# Patient Record
Sex: Female | Born: 1975
Health system: Southern US, Community
[De-identification: ages and names within clinical notes are randomized; demographics above are authoritative.]

## PROBLEM LIST (undated history)

## (undated) DIAGNOSIS — E7800 Pure hypercholesterolemia, unspecified: Secondary | ICD-10-CM

## (undated) DIAGNOSIS — K5900 Constipation, unspecified: Secondary | ICD-10-CM

## (undated) DIAGNOSIS — K8050 Calculus of bile duct without cholangitis or cholecystitis without obstruction: Secondary | ICD-10-CM

## (undated) DIAGNOSIS — I639 Cerebral infarction, unspecified: Secondary | ICD-10-CM

## (undated) DIAGNOSIS — I10 Essential (primary) hypertension: Secondary | ICD-10-CM

## (undated) DIAGNOSIS — E119 Type 2 diabetes mellitus without complications: Secondary | ICD-10-CM

## (undated) DIAGNOSIS — R079 Chest pain, unspecified: Secondary | ICD-10-CM

## (undated) DIAGNOSIS — K219 Gastro-esophageal reflux disease without esophagitis: Secondary | ICD-10-CM

## (undated) DIAGNOSIS — D649 Anemia, unspecified: Secondary | ICD-10-CM

## (undated) HISTORY — PX: COLONOSCOPY: SHX174

## (undated) HISTORY — DX: Anemia, unspecified: D64.9

## (undated) HISTORY — DX: Morbid (severe) obesity due to excess calories: E66.01

## (undated) HISTORY — DX: Constipation, unspecified: K59.00

## (undated) HISTORY — DX: Essential (primary) hypertension: I10

## (undated) HISTORY — DX: Pure hypercholesterolemia, unspecified: E78.00

## (undated) HISTORY — DX: Cerebral infarction, unspecified: I63.9

## (undated) HISTORY — PX: REDUCTION MAMMAPLASTY: SUR839

## (undated) HISTORY — PX: WISDOM TOOTH EXTRACTION: SHX21

---
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
## 2001-10-15 HISTORY — PX: TUBAL LIGATION: SHX77

## 2002-10-15 HISTORY — PX: BREAST REDUCTION SURGERY: SHX8

## 2002-10-15 HISTORY — PX: REDUCTION MAMMAPLASTY: SUR839

## 2017-04-29 ENCOUNTER — Encounter: Payer: Self-pay | Admitting: Family Medicine

## 2017-08-29 DIAGNOSIS — J069 Acute upper respiratory infection, unspecified: Secondary | ICD-10-CM | POA: Diagnosis not present

## 2017-10-15 DIAGNOSIS — G459 Transient cerebral ischemic attack, unspecified: Secondary | ICD-10-CM

## 2017-10-15 HISTORY — DX: Transient cerebral ischemic attack, unspecified: G45.9

## 2017-10-20 ENCOUNTER — Other Ambulatory Visit: Payer: Self-pay

## 2017-10-20 ENCOUNTER — Emergency Department (HOSPITAL_BASED_OUTPATIENT_CLINIC_OR_DEPARTMENT_OTHER): Payer: BLUE CROSS/BLUE SHIELD

## 2017-10-20 ENCOUNTER — Encounter (HOSPITAL_BASED_OUTPATIENT_CLINIC_OR_DEPARTMENT_OTHER): Payer: Self-pay | Admitting: Adult Health

## 2017-10-20 ENCOUNTER — Observation Stay (HOSPITAL_BASED_OUTPATIENT_CLINIC_OR_DEPARTMENT_OTHER)
Admission: EM | Admit: 2017-10-20 | Discharge: 2017-10-22 | Disposition: A | Discharge status: Home / Self Care | Payer: BLUE CROSS/BLUE SHIELD | Attending: Family Medicine | Admitting: Family Medicine

## 2017-10-20 DIAGNOSIS — G4489 Other headache syndrome: Secondary | ICD-10-CM | POA: Diagnosis not present

## 2017-10-20 DIAGNOSIS — E1169 Type 2 diabetes mellitus with other specified complication: Secondary | ICD-10-CM

## 2017-10-20 DIAGNOSIS — G459 Transient cerebral ischemic attack, unspecified: Principal | ICD-10-CM | POA: Insufficient documentation

## 2017-10-20 DIAGNOSIS — Z7984 Long term (current) use of oral hypoglycemic drugs: Secondary | ICD-10-CM | POA: Diagnosis not present

## 2017-10-20 DIAGNOSIS — E119 Type 2 diabetes mellitus without complications: Secondary | ICD-10-CM | POA: Insufficient documentation

## 2017-10-20 DIAGNOSIS — D649 Anemia, unspecified: Secondary | ICD-10-CM | POA: Diagnosis not present

## 2017-10-20 DIAGNOSIS — E669 Obesity, unspecified: Secondary | ICD-10-CM

## 2017-10-20 DIAGNOSIS — R42 Dizziness and giddiness: Secondary | ICD-10-CM

## 2017-10-20 DIAGNOSIS — R531 Weakness: Secondary | ICD-10-CM | POA: Diagnosis not present

## 2017-10-20 DIAGNOSIS — R51 Headache: Secondary | ICD-10-CM | POA: Diagnosis not present

## 2017-10-20 DIAGNOSIS — R519 Headache, unspecified: Secondary | ICD-10-CM

## 2017-10-20 HISTORY — DX: Type 2 diabetes mellitus without complications: E11.9

## 2017-10-20 LAB — PROTIME-INR
INR: 1.05
PROTHROMBIN TIME: 13.6 s (ref 11.4–15.2)

## 2017-10-20 LAB — APTT: aPTT: 27 seconds (ref 24–36)

## 2017-10-20 LAB — CBG MONITORING, ED: Glucose-Capillary: 92 mg/dL (ref 65–99)

## 2017-10-20 MED ORDER — SODIUM CHLORIDE 0.9 % IV BOLUS (SEPSIS)
1000.0000 mL | Freq: Once | INTRAVENOUS | Status: AC
  Administered 2017-10-21: 1000 mL via INTRAVENOUS

## 2017-10-20 MED ORDER — PROCHLORPERAZINE EDISYLATE 5 MG/ML IJ SOLN
10.0000 mg | Freq: Once | INTRAMUSCULAR | Status: AC
  Administered 2017-10-21: 10 mg via INTRAVENOUS
  Filled 2017-10-20: qty 2

## 2017-10-20 MED ORDER — DIPHENHYDRAMINE HCL 50 MG/ML IJ SOLN
25.0000 mg | Freq: Once | INTRAMUSCULAR | Status: AC
  Administered 2017-10-21: 25 mg via INTRAVENOUS
  Filled 2017-10-20: qty 1

## 2017-10-20 NOTE — ED Provider Notes (Signed)
[facility] Provider Note   CSN: [id number] Arrival date & time: 10/20/17  2303     History   Chief Complaint Chief Complaint  Patient presents with  . Dizziness    HPI Rhonda Le is a 42 y.o. female.  HPI  Patient evaluated at 11:38pm.  This a 42 year old female with a history of diabetes who presents with headache and dizziness.  Patient went to bed with her husband around 10:10 PM.  She woke up around 10:45pm with a 9 out of 10 headache on the right side.  No history of migraines.  She reports both room spinning dizziness and lightheadedness.  She felt like she could not walk.  She states she had 2 prior episodes of dizziness associated with headache since New Year's Eve.  She thought they were associated with low blood sugars.  At that time she ate something with sugar and her symptoms resolved.  Patient was noted to have some ataxia by nursing.  Level 5 caveat for acuity of condition    Past Medical History:  Diagnosis Date  . Diabetes mellitus without complication ([place])     There are no active problems to display for this patient.   History reviewed. No pertinent surgical history.  OB History    No data available       Home Medications    Prior to Admission medications   Medication Sig Start Date End Date Taking? Authorizing Provider  metFORMIN (GLUCOPHAGE) 1000 MG tablet Take 500 mg by mouth 2 (two) times daily with a meal.   Yes [provider]  simvastatin (ZOCOR) 10 MG tablet Take 10 mg by mouth daily.   Yes [provider]    Family History History reviewed. No pertinent family history.  Social History Social History   Tobacco Use  . Smoking status: Never Smoker  Substance Use Topics  . Alcohol use: Yes    Frequency: Never  . Drug use: No     Allergies   Patient has no known allergies.   Review of Systems Review of Systems  Constitutional: Negative for fever.  Respiratory:  Negative for chest tightness.   Cardiovascular: Negative for chest pain.  Gastrointestinal: Negative for abdominal pain.  Neurological: Positive for dizziness, weakness and headaches.  All other systems reviewed and are negative.    Physical Exam Updated Vital Signs Temp 98.1 F (36.7 C)   Ht 5\' 8"  (1.727 m)   Wt 104.3 kg (230 lb)   LMP 10/19/2017 (Exact Date)   BMI 34.97 kg/m   Physical Exam  Constitutional: She is oriented to person, place, and time.  Anxious appearing but nontoxic, no acute distress  HENT:  Head: Normocephalic and atraumatic.  Eyes: Pupils are equal, round, and reactive to light.  No nystagmus noted  Cardiovascular: Normal rate and regular rhythm.  Pulmonary/Chest: Effort normal and breath sounds normal. No respiratory distress. She has no wheezes.  Abdominal: Soft. There is no tenderness.  Neurological: She is alert and oriented to person, place, and time.  Cranial nerves II through XII intact, no facial asymmetry noted, no dysmetria to finger-nose-finger, no drift noted, patient does have 4+ out of 5 strength in give weakness in the left upper and lower extremities in all muscle groups, gait testing deferred  Skin: Skin is warm and dry.  Psychiatric: She has a normal mood and affect.  Nursing note and vitals reviewed.    ED Treatments / Results  Labs (all labs ordered are listed,  but only abnormal results are displayed) Labs Reviewed  CBC - Abnormal; Notable for the following components:      Result Value   Hemoglobin 11.6 (*)    HCT 35.5 (*)    All other components within normal limits  PROTIME-INR  APTT  DIFFERENTIAL  COMPREHENSIVE METABOLIC PANEL  TROPONIN I  PREGNANCY, URINE  CBG MONITORING, ED    EKG  EKG Interpretation  Date/Time:  Sunday October 20 2017 23:21:17 EST Ventricular Rate:  68 PR Interval:  154 QRS Duration: 74 QT Interval:  412 QTC Calculation: 438 R Axis:   50 Text Interpretation:  Normal sinus rhythm  Nonspecific ST abnormality Abnormal ECG Confirmed by Thayer Jew [phone]) on 10/20/2017 11:46:47 PM       Radiology Ct Head Code Stroke Wo Contrast`  Result Date: 10/21/2017 CLINICAL DATA:  Code stroke. 42 y/o F; right-sided headache and ataxia. EXAM: CT HEAD WITHOUT CONTRAST TECHNIQUE: Contiguous axial images were obtained from the base of the skull through the vertex without intravenous contrast. COMPARISON:  None. FINDINGS: Brain: No evidence of acute infarction, hemorrhage, hydrocephalus, extra-axial collection or mass lesion/mass effect. Vascular: Mild calcific atherosclerosis of carotid siphons. Skull: Normal. Negative for fracture or focal lesion. Sinuses/Orbits: No acute finding. Other: None. ASPECTS [facility] Stroke Program Early CT Score) - Ganglionic level infarction (caudate, lentiform nuclei, internal capsule, insula, M1-M3 cortex): 7 - Supraganglionic infarction (M4-M6 cortex): 3 Total score (0-10 with 10 being normal): 10 IMPRESSION: 1. Negative CT of the head. 2. ASPECTS is 10 These results were called by telephone at the time of interpretation on 10/21/2017 at 12:10 am to Dr. Thayer Jew , who verbally acknowledged these results. Electronically Signed   By: Kristine Garbe M.D.   On: 10/21/2017 00:10    Procedures Procedures (including critical care time)   CRITICAL CARE Performed by: Merryl Hacker   Total critical care time: 25 minutes  Critical care time was exclusive of separately billable procedures and treating other patients.  Critical care was necessary to treat or prevent imminent or life-threatening deterioration.  Critical care was time spent personally by me on the following activities: development of treatment plan with patient and/or surrogate as well as nursing, discussions with consultants, evaluation of patient's response to treatment, examination of patient, obtaining history from patient or surrogate, ordering and performing treatments and  interventions, ordering and review of laboratory studies, ordering and review of radiographic studies, pulse oximetry and re-evaluation of patient's condition.  Medications Ordered in ED Medications  prochlorperazine (COMPAZINE) injection 10 mg (not administered)  diphenhydrAMINE (BENADRYL) injection 25 mg (not administered)  sodium chloride 0.9 % bolus 1,000 mL (not administered)     Initial Impression / Assessment and Plan / ED Course  I have reviewed the triage vital signs and the nursing notes.  Pertinent labs & imaging results that were available during my care of the patient were reviewed by me and considered in my medical decision making (see chart for details).     Patient presents with acute onset headache and dizziness.  Also has some left-sided deficits.  There is subtle but present.  Code stroke was initiated as she is within the window.  Complex migraine is also a consideration.  Patient was evaluated by tele-neurology, Dr. Stark Klein.  I discussed the patient with Dr. Stark Klein and after his assessment.  Exam was somewhat inconsistent with an NIH of 1.  He did discuss TPA with the patient who declined TPA.  Recommends urgent MRI and treatment for complex  migraine.  He did not discuss with me admission; however, he has not written his full consult note.  Will initiate transfer to the emergency room at [facility] so patient can have her MRI.  Based on his consultation note, patient may need admission for full stroke workup.  Discussed this plan with Dr. Leonette Monarch, [facility] ED. he has accepted the patient in transfer.  Final Clinical Impressions(s) / ED Diagnoses   Final diagnoses:  Dizziness  Other headache syndrome    ED Discharge Orders    None       Saraiah Bhat, Barbette Hair, MD 10/21/17 [phone]

## 2017-10-20 NOTE — ED Notes (Addendum)
Code Stroke activated. Pt taken to CT. Fernande Boyden, RN on teleneuro.

## 2017-10-20 NOTE — ED Notes (Signed)
Pt returned from CT °

## 2017-10-20 NOTE — ED Triage Notes (Signed)
Presents with right sided head pain and feeling like "I am going to pass out. It feels like I am drunk and like it hard to walk a straight line. It has been off and on since New Year's Eve and tonight it got worse. I was talking to my husband and lying in bed and dozed off then suddenly woke and felt like I was going to pass and I couldn't move my head. It was hurting so badly on the right side and I felt like I was going to pass out and felt off balance. BEgan at 10:50pm today.

## 2017-10-20 NOTE — ED Notes (Signed)
Tele Stroke Machine in room and activated.

## 2017-10-21 ENCOUNTER — Encounter (HOSPITAL_COMMUNITY): Payer: Self-pay | Admitting: Physician Assistant

## 2017-10-21 ENCOUNTER — Other Ambulatory Visit (HOSPITAL_COMMUNITY): Payer: BLUE CROSS/BLUE SHIELD

## 2017-10-21 ENCOUNTER — Observation Stay (HOSPITAL_COMMUNITY): Payer: BLUE CROSS/BLUE SHIELD

## 2017-10-21 ENCOUNTER — Observation Stay (HOSPITAL_BASED_OUTPATIENT_CLINIC_OR_DEPARTMENT_OTHER): Payer: BLUE CROSS/BLUE SHIELD

## 2017-10-21 ENCOUNTER — Emergency Department (HOSPITAL_COMMUNITY): Payer: BLUE CROSS/BLUE SHIELD

## 2017-10-21 DIAGNOSIS — G43109 Migraine with aura, not intractable, without status migrainosus: Secondary | ICD-10-CM | POA: Diagnosis not present

## 2017-10-21 DIAGNOSIS — E119 Type 2 diabetes mellitus without complications: Secondary | ICD-10-CM

## 2017-10-21 DIAGNOSIS — G459 Transient cerebral ischemic attack, unspecified: Secondary | ICD-10-CM

## 2017-10-21 DIAGNOSIS — D649 Anemia, unspecified: Secondary | ICD-10-CM

## 2017-10-21 DIAGNOSIS — I361 Nonrheumatic tricuspid (valve) insufficiency: Secondary | ICD-10-CM

## 2017-10-21 DIAGNOSIS — R42 Dizziness and giddiness: Secondary | ICD-10-CM

## 2017-10-21 DIAGNOSIS — E669 Obesity, unspecified: Secondary | ICD-10-CM

## 2017-10-21 DIAGNOSIS — E1169 Type 2 diabetes mellitus with other specified complication: Secondary | ICD-10-CM

## 2017-10-21 DIAGNOSIS — R51 Headache: Secondary | ICD-10-CM | POA: Diagnosis not present

## 2017-10-21 LAB — VITAMIN B12: Vitamin B-12: 362 pg/mL (ref 180–914)

## 2017-10-21 LAB — RAPID URINE DRUG SCREEN, HOSP PERFORMED
AMPHETAMINES: NOT DETECTED
BARBITURATES: NOT DETECTED
BENZODIAZEPINES: NOT DETECTED
Cocaine: NOT DETECTED
Opiates: NOT DETECTED
TETRAHYDROCANNABINOL: NOT DETECTED

## 2017-10-21 LAB — COMPREHENSIVE METABOLIC PANEL
ALBUMIN: 4 g/dL (ref 3.5–5.0)
ALK PHOS: 51 U/L (ref 38–126)
ALT: 33 U/L (ref 14–54)
AST: 27 U/L (ref 15–41)
Anion gap: 7 (ref 5–15)
BILIRUBIN TOTAL: 0.5 mg/dL (ref 0.3–1.2)
BUN: 11 mg/dL (ref 6–20)
CALCIUM: 9.4 mg/dL (ref 8.9–10.3)
CO2: 22 mmol/L (ref 22–32)
CREATININE: 0.93 mg/dL (ref 0.44–1.00)
Chloride: 107 mmol/L (ref 101–111)
GFR calc Af Amer: >60 mL/min (ref >60)
Glucose, Bld: 97 mg/dL (ref 65–99)
POTASSIUM: 3.9 mmol/L (ref 3.5–5.1)
Sodium: 136 mmol/L (ref 135–145)
TOTAL PROTEIN: 7.4 g/dL (ref 6.5–8.1)

## 2017-10-21 LAB — CBC
HCT: 35.5 % — ABNORMAL LOW (ref 36.0–46.0)
HEMOGLOBIN: 11.6 g/dL — AB (ref 12.0–15.0)
MCH: 28.6 pg (ref 26.0–34.0)
MCHC: 32.7 g/dL (ref 30.0–36.0)
MCV: 87.7 fL (ref 78.0–100.0)
PLATELETS: 278 10*3/uL (ref 150–400)
RBC: 4.05 MIL/uL (ref 3.87–5.11)
RDW: 14.2 % (ref 11.5–15.5)
WBC: 8.4 10*3/uL (ref 4.0–10.5)

## 2017-10-21 LAB — IRON AND TIBC
IRON: 30 ug/dL (ref 28–170)
Saturation Ratios: 7 % — ABNORMAL LOW (ref 10.4–31.8)
TIBC: 413 ug/dL (ref 250–450)
UIBC: 383 ug/dL

## 2017-10-21 LAB — DIFFERENTIAL
BASOS ABS: 0 10*3/uL (ref 0.0–0.1)
Basophils Relative: 0 %
EOS ABS: 0.3 10*3/uL (ref 0.0–0.7)
Eosinophils Relative: 3 %
LYMPHS ABS: 3.1 10*3/uL (ref 0.7–4.0)
LYMPHS PCT: 37 %
MONOS PCT: 9 %
Monocytes Absolute: 0.8 10*3/uL (ref 0.1–1.0)
NEUTROS ABS: 4.2 10*3/uL (ref 1.7–7.7)
NEUTROS PCT: 51 %

## 2017-10-21 LAB — HEMOGLOBIN A1C
Hgb A1c MFr Bld: 6.1 % — ABNORMAL HIGH (ref 4.8–5.6)
Mean Plasma Glucose: 128.37 mg/dL

## 2017-10-21 LAB — LIPID PANEL
CHOL/HDL RATIO: 3 ratio
CHOLESTEROL: 160 mg/dL (ref 0–200)
HDL: 53 mg/dL (ref >40)
LDL Cholesterol: 87 mg/dL (ref 0–99)
TRIGLYCERIDES: 101 mg/dL (ref <150)
VLDL: 20 mg/dL (ref 0–40)

## 2017-10-21 LAB — TSH: TSH: 3.14 u[IU]/mL (ref 0.350–4.500)

## 2017-10-21 LAB — RETICULOCYTES
RBC.: 3.94 MIL/uL (ref 3.87–5.11)
RETIC COUNT ABSOLUTE: 78.8 10*3/uL (ref 19.0–186.0)
Retic Ct Pct: 2 % (ref 0.4–3.1)

## 2017-10-21 LAB — ECHOCARDIOGRAM COMPLETE
HEIGHTINCHES: 68 in
Weight: 3680 oz

## 2017-10-21 LAB — GLUCOSE, CAPILLARY
GLUCOSE-CAPILLARY: 123 mg/dL — AB (ref 65–99)
Glucose-Capillary: 127 mg/dL — ABNORMAL HIGH (ref 65–99)
Glucose-Capillary: 148 mg/dL — ABNORMAL HIGH (ref 65–99)

## 2017-10-21 LAB — FERRITIN: Ferritin: 7 ng/mL — ABNORMAL LOW (ref 11–307)

## 2017-10-21 LAB — FOLATE: FOLATE: 25 ng/mL (ref >5.9)

## 2017-10-21 LAB — TROPONIN I

## 2017-10-21 LAB — PREGNANCY, URINE: Preg Test, Ur: NEGATIVE

## 2017-10-21 MED ORDER — ONDANSETRON HCL 4 MG/2ML IJ SOLN: 4.0000 mg | Freq: Four times a day (QID) | INTRAMUSCULAR | Status: DC | PRN

## 2017-10-21 MED ORDER — HYDROCODONE-ACETAMINOPHEN 5-325 MG PO TABS: 1.0000 | ORAL_TABLET | ORAL | Status: DC | PRN

## 2017-10-21 MED ORDER — SIMVASTATIN 40 MG PO TABS: 40.0000 mg | ORAL_TABLET | Freq: Every day | ORAL | Status: DC

## 2017-10-21 MED ORDER — ATORVASTATIN CALCIUM 40 MG PO TABS: 40.0000 mg | ORAL_TABLET | Freq: Every day | ORAL | Status: DC

## 2017-10-21 MED ORDER — BISACODYL 10 MG RE SUPP: 10.0000 mg | Freq: Every day | RECTAL | Status: DC | PRN

## 2017-10-21 MED ORDER — ACETAMINOPHEN 650 MG RE SUPP: 650.0000 mg | Freq: Four times a day (QID) | RECTAL | Status: DC | PRN

## 2017-10-21 MED ORDER — INSULIN ASPART 100 UNIT/ML ~~LOC~~ SOLN
0.0000 [IU] | Freq: Three times a day (TID) | SUBCUTANEOUS | Status: DC
  Administered 2017-10-21 – 2017-10-22 (×3): 1 [IU] via SUBCUTANEOUS

## 2017-10-21 MED ORDER — ASPIRIN 81 MG PO CHEW
81.0000 mg | CHEWABLE_TABLET | Freq: Every day | ORAL | Status: DC
  Administered 2017-10-21 – 2017-10-22 (×2): 81 mg via ORAL
  Filled 2017-10-21 (×2): qty 1

## 2017-10-21 MED ORDER — HYDRALAZINE HCL 20 MG/ML IJ SOLN: 5.0000 mg | Freq: Three times a day (TID) | INTRAMUSCULAR | Status: DC | PRN

## 2017-10-21 MED ORDER — SENNOSIDES-DOCUSATE SODIUM 8.6-50 MG PO TABS: 1.0000 | ORAL_TABLET | Freq: Every evening | ORAL | Status: DC | PRN

## 2017-10-21 MED ORDER — KETOROLAC TROMETHAMINE 30 MG/ML IJ SOLN: 30.0000 mg | Freq: Four times a day (QID) | INTRAMUSCULAR | Status: DC | PRN

## 2017-10-21 MED ORDER — ACETAMINOPHEN 325 MG PO TABS
650.0000 mg | ORAL_TABLET | Freq: Four times a day (QID) | ORAL | Status: DC | PRN
Start: 2017-10-21 — End: 2017-10-22
  Administered 2017-10-22: 650 mg via ORAL
  Filled 2017-10-21: qty 2

## 2017-10-21 MED ORDER — SIMVASTATIN 5 MG PO TABS
10.0000 mg | ORAL_TABLET | Freq: Every day | ORAL | Status: DC
  Filled 2017-10-21: qty 1

## 2017-10-21 MED ORDER — SODIUM CHLORIDE 0.9 % IV SOLN
INTRAVENOUS | Status: DC
  Administered 2017-10-21: 15:00:00 via INTRAVENOUS

## 2017-10-21 MED ORDER — GADOBENATE DIMEGLUMINE 529 MG/ML IV SOLN
20.0000 mL | Freq: Once | INTRAVENOUS | Status: AC
  Administered 2017-10-21: 20 mL via INTRAVENOUS

## 2017-10-21 MED ORDER — PROCHLORPERAZINE EDISYLATE 5 MG/ML IJ SOLN: 10.0000 mg | Freq: Four times a day (QID) | INTRAMUSCULAR | Status: DC | PRN

## 2017-10-21 MED ORDER — ENOXAPARIN SODIUM 40 MG/0.4ML ~~LOC~~ SOLN
40.0000 mg | Freq: Every day | SUBCUTANEOUS | Status: DC
  Administered 2017-10-21 – 2017-10-22 (×2): 40 mg via SUBCUTANEOUS
  Filled 2017-10-21 (×3): qty 0.4

## 2017-10-21 MED ORDER — ONDANSETRON HCL 4 MG PO TABS: 4.0000 mg | ORAL_TABLET | Freq: Four times a day (QID) | ORAL | Status: DC | PRN

## 2017-10-21 MED ORDER — SIMVASTATIN 20 MG PO TABS
20.0000 mg | ORAL_TABLET | Freq: Every day | ORAL | Status: DC
  Administered 2017-10-21 – 2017-10-22 (×2): 20 mg via ORAL
  Filled 2017-10-21 (×2): qty 1

## 2017-10-21 MED ORDER — HEPARIN SODIUM (PORCINE) 5000 UNIT/ML IJ SOLN: 5000.0000 [IU] | Freq: Three times a day (TID) | INTRAMUSCULAR | Status: DC

## 2017-10-21 NOTE — H&P (Signed)
History and Physical    Rhonda Le FAO:[id number] DOB: December 01, 1975 DOA: 10/20/2017   PCP: Patient, No Pcp Per   Patient coming from:  Home    Chief Complaint: Left sided weakness, headaches and dizziness  HPI: Rhonda Le is a 42 y.o. female with a history of diabetes, presenting to med center at [facility] on Sunday night, with 3 episodes of right sided headaches, dizziness, and left-sided weakness, first on December 31, January 3, and last night.  She had gone to bed around 2200 hrs., waking up shortly thereafter around 22:45 hrs, with the symptoms mentioned above.Currently symptom free  She denies dysarthria, dysphagia, or loss of vision. No photophobia.  At med Center at [facility], code stroke was initiated, and at the time, she had a NIH SS of 1 as per chart notes.  Denies vertigo, confusion or seizures. Denies any chest pain, or shortness of breath. Denies any fever or chills, or night sweats. No tobacco. No new meds or hormonal supplements. Does not take daily ASA  or anticoagulants.Denies any recent long distance trips or recent surgeries. No sick contacts. She reports "plenty stressors at work".  Patient is sedentary. Family history of stroke in her mother and maternal aunt   ED Course:  BP 115/66 (BP Location: Right Arm)   Pulse 63   Temp 98.1 F (36.7 C) (Oral)   Resp 17   Ht 5\' 8"  (1.727 m)   Wt 104.3 kg (230 lb)   LMP 10/19/2017 (Exact Date)   SpO2 99%   BMI 34.97 kg/m   MRI of the brain was normal.  ABCD2 score is 5 according to neurology..  Patient declined tPA on presentation  Currently all the TIA workup is pending Chemistries unremarkable. White count 8.4, hemoglobin 11.6, platelets 278.   Review of Systems:  As per HPI otherwise all other systems reviewed and are negative  Past Medical History:  Diagnosis Date  . Diabetes mellitus without complication ([place])     History reviewed. No pertinent surgical history.  Social History Social History     Socioeconomic History  . Marital status: Married    Spouse name: Not on file  . Number of children: Not on file  . Years of education: Not on file  . Highest education level: Not on file  Social Needs  . Financial resource strain: Not on file  . Food insecurity - worry: Not on file  . Food insecurity - inability: Not on file  . Transportation needs - medical: Not on file  . Transportation needs - non-medical: Not on file  Occupational History  . Not on file  Tobacco Use  . Smoking status: Never Smoker  . Smokeless tobacco: Never Used  Substance and Sexual Activity  . Alcohol use: Yes    Frequency: Never  . Drug use: No  . Sexual activity: Not on file  Other Topics Concern  . Not on file  Social History Narrative  . Not on file     No Known Allergies  Family History  Problem Relation Age of Onset  . Stroke Mother   . Stroke Maternal Aunt       Prior to Admission medications   Medication Sig Start Date End Date Taking? Authorizing Provider  metFORMIN (GLUCOPHAGE) 1000 MG tablet Take 500 mg by mouth 2 (two) times daily with a meal.   Yes [provider]  simvastatin (ZOCOR) 10 MG tablet Take 10 mg by mouth daily.   Yes [provider]  Physical Exam:  Vitals:   10/21/17 0245 10/21/17 0300 10/21/17 0508 10/21/17 0650  BP: 137/81 137/85 (!) 142/89 115/66  Pulse: 65 63 70 63  Resp:  20 18 17   Temp:    98.1 F (36.7 C)  TempSrc:    Oral  SpO2: 95% 94% 98% 99%  Weight:      Height:       Constitutional: NAD, calm, comfortable  Eyes: PERRL, lids and conjunctivae normal. No nystagmus noted  ENMT: Mucous membranes are moist, without exudate or lesions  Neck: normal, supple, no masses, no thyromegaly Respiratory: clear to auscultation bilaterally, no wheezing, no crackles. Normal respiratory effort  Cardiovascular: Regular rate and rhythm,  murmur, rubs or gallops. No extremity edema. 2+ pedal pulses. No carotid bruits.  Abdomen: Soft, non  tender, No hepatosplenomegaly. Bowel sounds positive.  Musculoskeletal: no clubbing / cyanosis. Moves all extremities Skin: no jaundice, No lesions.  Neurologic: Sensation intact  Strength equal in all extremities Psychiatric:   Alert and oriented x 3. Normal mood.     Labs on Admission: I have personally reviewed following labs and imaging studies  CBC: Recent Labs  Lab 10/20/17 2330  WBC 8.4  NEUTROABS 4.2  HGB 11.6*  HCT 35.5*  MCV 87.7  PLT 778    Basic Metabolic Panel: Recent Labs  Lab 10/20/17 2330  NA 136  K 3.9  CL 107  CO2 22  GLUCOSE 97  BUN 11  CREATININE 0.93  CALCIUM 9.4    GFR: Estimated Creatinine Clearance: 98.6 mL/min (by C-G formula based on SCr of 0.93 mg/dL).  Liver Function Tests: Recent Labs  Lab 10/20/17 2330  AST 27  ALT 33  ALKPHOS 51  BILITOT 0.5  PROT 7.4  ALBUMIN 4.0   No results for input(s): LIPASE, AMYLASE in the last 168 hours. No results for input(s): AMMONIA in the last 168 hours.  Coagulation Profile: Recent Labs  Lab 10/20/17 2330  INR 1.05    Cardiac Enzymes: Recent Labs  Lab 10/20/17 2330  TROPONINI <0.03    BNP (last 3 results) No results for input(s): PROBNP in the last 8760 hours.  HbA1C: No results for input(s): HGBA1C in the last 72 hours.  CBG: Recent Labs  Lab 10/20/17 2336  GLUCAP 92    Lipid Profile: No results for input(s): CHOL, HDL, LDLCALC, TRIG, CHOLHDL, LDLDIRECT in the last 72 hours.  Thyroid Function Tests: No results for input(s): TSH, T4TOTAL, FREET4, T3FREE, THYROIDAB in the last 72 hours.  Anemia Panel: No results for input(s): VITAMINB12, FOLATE, FERRITIN, TIBC, IRON, RETICCTPCT in the last 72 hours.  Urine analysis: No results found for: COLORURINE, APPEARANCEUR, LABSPEC, PHURINE, GLUCOSEU, HGBUR, BILIRUBINUR, KETONESUR, PROTEINUR, UROBILINOGEN, NITRITE, LEUKOCYTESUR  Sepsis Labs: @LABRCNTIP (procalcitonin:4,lacticidven:4) )No results found for this or any previous  visit (from the past 240 hour(s)).   Radiological Exams on Admission: Mr Brain Wo Contrast  Result Date: 10/21/2017 CLINICAL DATA:  43 y/o F; dizziness and left-sided weakness with right-sided headache. EXAM: MRI HEAD WITHOUT CONTRAST TECHNIQUE: Multiplanar, multiecho pulse sequences of the brain and surrounding structures were obtained without intravenous contrast. COMPARISON:  10/20/2016 CT head FINDINGS: Brain: No acute infarction, hemorrhage, hydrocephalus, extra-axial collection or mass lesion. Vascular: Normal flow voids. Skull and upper cervical spine: Normal marrow signal. Sinuses/Orbits: Negative. Other: None. IMPRESSION: Normal MRI of the brain. Electronically Signed   By: Kristine Garbe M.D.   On: 10/21/2017 05:10   Ct Head Code Stroke Wo Contrast`  Result Date: 10/21/2017 CLINICAL DATA:  Code stroke. 42 y/o F; right-sided headache and ataxia. EXAM: CT HEAD WITHOUT CONTRAST TECHNIQUE: Contiguous axial images were obtained from the base of the skull through the vertex without intravenous contrast. COMPARISON:  None. FINDINGS: Brain: No evidence of acute infarction, hemorrhage, hydrocephalus, extra-axial collection or mass lesion/mass effect. Vascular: Mild calcific atherosclerosis of carotid siphons. Skull: Normal. Negative for fracture or focal lesion. Sinuses/Orbits: No acute finding. Other: None. ASPECTS [facility] Stroke Program Early CT Score) - Ganglionic level infarction (caudate, lentiform nuclei, internal capsule, insula, M1-M3 cortex): 7 - Supraganglionic infarction (M4-M6 cortex): 3 Total score (0-10 with 10 being normal): 10 IMPRESSION: 1. Negative CT of the head. 2. ASPECTS is 10 These results were called by telephone at the time of interpretation on 10/21/2017 at 12:10 am to Dr. Thayer Jew , who verbally acknowledged these results. Electronically Signed   By: Kristine Garbe M.D.   On: 10/21/2017 00:10    EKG: Independently reviewed.  Assessment/Plan Active  Problems:   TIA (transient ischemic attack)   Anemia   Diabetes (HCC)  TIA, stroke like symptoms versus migraine.   MRI of the brain was normal without acute changes .  ABCD2 score is 5 according to neurology. Chemistries unremarkable. Tn Neg, EKG SR without ACS.  Patient declined tPA on presentation RF include family history of stroke, obesity, diabetes  Telemetry observation Await MRA head and neck results  2D echo Check lipid panel and A1C Tylenol for headaches Allow permissive HTN, hydralazine for BP 210/110  Compazine for dizziness   Type II Diabetes Current blood sugar level is 97  No results found for: HGBA1C Hgb A1C Hold home oral diabetic medications.   SSI  Anemia  Hemoglobin on admission 11.6 . Baseline Hb unknown. She denies bleeding issues or heavy menses  Repeat CBC in am  No transfusion is indicated at this time Anemia panel     DVT prophylaxis:  Lovenox Code Status:    Full  Family Communication:  Discussed with patient and Husband  Disposition Plan: Expect patient to be discharged to home after condition improves Consults called:    Neurology, Dr. Cheral Marker  Admission status: Tele Obs    Sharene Butters, PA-C [facility]   Amion text  [id number]   10/21/2017, 9:01 AM

## 2017-10-21 NOTE — ED Notes (Signed)
Attempted report 

## 2017-10-21 NOTE — ED Notes (Signed)
Patient transported to MRI 

## 2017-10-21 NOTE — ED Notes (Signed)
Pt presents with dizziness and L sided weakness with R sided HA. Denies difficulty with speech, swallowing or vision. EDP and teleneuro assessments complete.

## 2017-10-21 NOTE — ED Notes (Signed)
Neuro Hospitalist at bedside

## 2017-10-21 NOTE — ED Notes (Signed)
Patient asleep and difficult to arouse to complete NIH at this time. Family at bedside.

## 2017-10-21 NOTE — ED Notes (Signed)
Tele neuro assessment complete.

## 2017-10-21 NOTE — ED Notes (Signed)
Report given.

## 2017-10-21 NOTE — ED Notes (Signed)
MRI notified of patients arrival

## 2017-10-21 NOTE — ED Provider Notes (Signed)
MSE was initiated and I personally evaluated the patient and placed orders (if any) at  3:12 AM on October 21, 2017.  The patient appears stable so that the remainder of the MSE may be completed by another provider.  Seen and examined  RRR CTAB NABS  Case d/w Dr. Horatio Pel call when returns from MRI  To MRI   Sherrilynn Gudgel, MD 10/21/17 [phone]

## 2017-10-21 NOTE — ED Notes (Signed)
ED Provider at bedside. [place] 

## 2017-10-21 NOTE — Progress Notes (Signed)
NEUROHOSPITALISTS STROKE TEAM - DAILY PROGRESS NOTE   ADMISSION HISTORY: Rhonda Le is an 42 y.o. female who initially presented to [facility] on Sunday night with acute onset of headache, dizziness and left sided weakness. A Code Stroke was initiated and she was evaluated by Teleneurology. She had an NIHSS of 1 with a somewhat inconsistent exam. Teleneurologist discussed tPA with the patient and she declined tPA. She was transferred to [facility] for stroke admission.   Teleneurology note reviewed: "51 yof with hx of diabetes since 2012 presents to hospital with headache and left sided weakness. She went to bed around 2200 and woke up with shortly thereafter with weakness on her left side. She denied any numbness but states arm and leg are weak but no altered speech or loss of vision. CT scan head was negative for acute process and presently she complains of moderate headache which is unusual for her. Husband states she is under intense stress related to her job and is reluctant to quit d/t financial reasons."  SUBJECTIVE (INTERVAL HISTORY) Husband is at the bedside. Patient is found laying in bed in NAD. Overall she feels her condition is completely resolved. Voices no new complaints. No new events reported overnight. She states she had a headache accompanying her symptoms which is some vision difficulty. She has remote history of migraine headaches with vision difficulties in the past   OBJECTIVE Lab Results: CBC:  Recent Labs  Lab 10/20/17 2330  WBC 8.4  HGB 11.6*  HCT 35.5*  MCV 87.7  PLT 278   BMP: Recent Labs  Lab 10/20/17 2330  NA 136  K 3.9  CL 107  CO2 22  GLUCOSE 97  BUN 11  CREATININE 0.93  CALCIUM 9.4   Liver Function Tests:  Recent Labs  Lab 10/20/17 2330  AST 27  ALT 33  ALKPHOS 51  BILITOT 0.5  PROT 7.4  ALBUMIN 4.0   Thyroid Function Studies:  Recent Labs    10/21/17 1017  TSH 3.140     Cardiac Enzymes:  Recent Labs  Lab 10/20/17 2330  TROPONINI <0.03   Coagulation Studies:  Recent Labs    10/20/17 2330  APTT 27  INR 1.05   [place] Work -Up:  Recent Labs    10/21/17 1017  VITAMINB12 362  FOLATE 25.0  IRON 30  RETICCTPCT 2.0   PHYSICAL EXAM Temp:  [98.1 F (36.7 C)-98.2 F (36.8 C)] 98.2 F (36.8 C) (01/07 0954) Pulse Rate:  [63-71] 65 (01/07 0954) Resp:  [17-21] 18 (01/07 0954) BP: (115-157)/(66-96) 131/86 (01/07 0954) SpO2:  [94 %-100 %] 99 % (01/07 0954) Weight:  [104.3 kg (230 lb)] 104.3 kg (230 lb) (01/06 2327) General - Well nourished, well developed, in no apparent distress HEENT-  Normocephalic, Normal external eye/conjunctiva.  Normal external ears. Normal external nose, mucus membranes and septum.   Cardiovascular - Regular rate and rhythm  Respiratory - Lungs clear bilaterally. No wheezing. Abdomen - soft and non-tender, BS normal Extremities- no edema or cyanosis Mental Status -  Level of arousal and orientation to time, place, and person were intact. Language including expression, naming, repetition, comprehension was assessed and found intact. Attention span and concentration were normal Recent and remote memory were intact Fund of Knowledge was assessed and was intact Cranial Nerves II - XII - II - Visual field intact OU III, IV, VI - Extraocular movements intact. V - Facial sensation intact bilaterally. VII - Facial movement intact bilaterally VIII - Hearing & vestibular  intact bilaterally X - Palate elevates symmetrically XI - Chin turning & shoulder shrug intact bilaterally. XII - Tongue protrusion intact Motor Strength - The patient's strength was normal in all extremities and pronator drift was absent.  Bulk was normal and fasciculations were absent.  Motor Tone - Muscle tone was assessed at the neck and appendages and was normal Sensory - Light touch was assessed and was symmetrical Coordination - The patient had normal  movements in the hands and feet with no ataxia or dysmetria.  Tremor was absent Gait and Station - deferred.  IMAGING: I have personally reviewed the radiological images below and agree with the radiology interpretations.  Mr Loreli Slot Wo Contrast Result Date: 10/21/2017 IMPRESSION: Normal MR angiography of the neck vessels and intracranial vessels. Electronically Signed   By: Nelson Chimes M.D.   On: 10/21/2017 09:55   Mr Brain Wo Contrast Result Date: 10/21/2017 IMPRESSION: Normal MRI of the brain. Electronically Signed   By: Kristine Garbe M.D.   On: 10/21/2017 05:10   Ct Head Code Stroke Wo Contrast` Result Date: 10/21/2017 IMPRESSION: 1. Negative CT of the head. 2. ASPECTS is 10 These results were called by telephone at the time of interpretation on 10/21/2017 at 12:10 am to Dr. Thayer Jew , who verbally acknowledged these results. Electronically Signed   By: Kristine Garbe M.D.   On: 10/21/2017 00:10   Echocardiogram:                                            Study Conclusions - Left ventricle: The cavity size was normal. There was mild focal   basal hypertrophy of the septum. Systolic function was normal.   The estimated ejection fraction was in the range of 60% to 65%.   Wall motion was normal; there were no regional wall motion   abnormalities. Left ventricular diastolic function parameters   were normal.   IMPRESSION: [name] is a 42 y.o. female with PMH of DM, HLD who initially presented to [place] on Sunday night with acute onset of headache, dizziness and left sided weakness.  MRI negative for acute findings  TIA vs Complicated migraine.  Suspected Etiology: Small vessel disease Resultant Symptoms: Headache and left-sided weakness Stroke Risk Factors: diabetes mellitus, hyperlipidemia and hypertension Other Stroke Risk Factors: Obesity, Body mass index is 34.97 kg/m.   Outstanding Stroke Work-up Studies:    Workup  completed  10/21/2017 ASSESSMENT:   Neurology exam stable.  Patient denies headache or left-sided weakness on exam today.  Stroke workup completed.  Diagnosis remains TIA versus complicated migraine.  Patient is to continue with aspirin and statin.  She will follow-up with neurology in 6 weeks.  PLAN  10/21/2017: Patient to be discharged home today Continue Aspirin/ Statin Ongoing aggressive stroke risk factor management Patient counseled to be compliant with her antithrombotic medications Patient counseled on Lifestyle modifications including, Diet, Exercise, and Stress Follow up with [facility] in 6 weeks  HYPERTENSION: Stable Long term BP goal normotensive. May slowly start B/P medications, if indicated Home Meds: NONE  HYPERLIPIDEMIA:    Component Value Date/Time   CHOL 160 10/21/2017 1017   TRIG 101 10/21/2017 1017   HDL 53 10/21/2017 1017   CHOLHDL 3.0 10/21/2017 1017   VLDL 20 10/21/2017 1017   LDLCALC 87 10/21/2017 1017  Home Meds:  Zocor  10 mg LDL  goal < 70 Increased to  Zocor to 20 mg daily Continue statin at discharge  DIABETES: Lab Results  Component Value Date   HGBA1C 6.1 (H) 10/21/2017  HgbA1c goal < 7.0  OBESITY Obesity, Body mass index is 34.97 kg/m. Greater than/equal to 30  Other Active Problems: Active Problems:   TIA (transient ischemic attack)   Anemia   Diabetes [facility])    Hospital day # 0 VTE prophylaxis: Lovenox  Diet : Diet heart healthy/carb modified Room service appropriate? Yes; Fluid consistency: Thin   FAMILY UPDATES: family at bedside  TEAM UPDATES: Karmen Bongo, MD   Prior Home Stroke Medications:  No antithrombotic  Discharge Stroke Meds:  Please discharge patient on aspirin 81 mg daily   Disposition: Final discharge disposition not confirmed Therapy Recs:               Home Home Equipment:         None Follow Up:  Follow-up Information    Garvin Fila, MD. Schedule an appointment as soon as possible  for a visit in 6 week(s).   Specialties:  Neurology, Radiology Contact information: [address] [place] [address] [phone]          Patient, No Pcp Per -PCP Follow up in 1-2 weeks   Case Management aware of need   Assessment & plan discussed with with attending physician and they are in agreement.    Mary Sella, ANP-C Stroke Neurology Team 10/21/2017 1:13 PM I have personally examined this patient, reviewed notes, independently viewed imaging studies, participated in medical decision making and plan of care.ROS completed by me personally and pertinent positives fully documented  I have made any additions or clarifications directly to the above note. Agree with note above. She presented with headache, dizziness and transient left-sided weakness possibly complicated migraine versus TIA from small vessel disease. Continue ongoing stroke workup. Recommend aspirin and statin. Check urine drug screen. Greater than 50% time during this 25 minute visit was spent on counseling and coordination of care about TIA and complicated migraine and answering questions  Antony Contras, MD Medical Director [place] Pager: [phone] 10/21/2017 3:32 PM  Neurology to sign-off at this time. Please call with any further questions or concerns. Thank you for this consultation.  To contact Stroke Continuity provider, please refer to [email or web address] After hours, contact General Neurology

## 2017-10-21 NOTE — Consult Note (Signed)
Referring Physician: Dr. Randal Buba    Chief Complaint: Headache with left sided weakness  HPI: Rhonda Le is an 42 y.o. female who initially presented to [place] on Sunday night with acute onset of headache, dizziness and left sided weakness. A Code Stroke was initiated and she was evaluated by Teleneurology. She had an NIHSS of 1 with a somewhat inconsistent exam. Teleneurologist discussed tPA with the patient and she declined tPA. She was transferred to [facility] for stroke admission.   Teleneurology note reviewed: "41 yof with hx of diabetes since 2012 presents to hospital with headache and left sided weakness. She went to bed around 2200 and woke up with shortly thereafter with weakness on her left side. She denied any numbness but states arm and leg are weak but no altered speech or loss of vision. CT scan head was negative for acute process and presently she complains of moderate headache which is unusual for her. Husband states she is under intense stress related to her job and is reluctant to quit d/t financial reasons."  Past Medical History:  Diagnosis Date  . Diabetes mellitus without complication ([place])     History reviewed. No pertinent surgical history.  History reviewed. No pertinent family history. Social History:  reports that  has never smoked. She does not have any smokeless tobacco history on file. She reports that she drinks alcohol. She reports that she does not use drugs.  Allergies: No Known Allergies  Home Medications:  Metformin Simvastatin  ROS: Positive for mild right sided headache. No photophobia, sonophobia or osmophobia. States left sided weakness and numbness have now resolved. Mild visual blurring OU has also resolved. Other ROS as per HPI.   Physical Examination: Blood pressure (!) 142/89, pulse 70, temperature 98.1 F (36.7 C), resp. rate 18, height '5\' 8"'  (1.727 m), weight 104.3 kg (230 lb), last menstrual period 10/19/2017, SpO2 98  %.  HEENT: [place]/AT Lungs: Respirations unlabored Ext: No edema  Neurologic Examination: Mental Status: Alert, oriented, thought content appropriate.  Speech fluent without evidence of aphasia.  Able to follow all commands without difficulty. Cranial Nerves: II:  Visual fields intact. No extinction. PERRL.  III,IV, VI: ptosis not present, EOMI without nystagmus V,VII: No facial droop. Facial temp sensation normal bilaterally VIII: hearing intact to conversation IX,X: No hypophonia XI: bilateral shoulder shrug is symmetric XII: midline tongue extension  Motor: Subtle 4+/5 left deltoid and grip. Subtle left HF and KE 4+/5. Otherwise 5/5 x 4. Subtle pronator drift LUE Sensory: Temp and light touch intact x 4. No extinction.  Deep Tendon Reflexes:  Normoactive x 4 without asymmetry Cerebellar: No ataxia with FNF bilaterally Gait: Normal gait and station.   Results for orders placed or performed during the hospital encounter of 10/20/17 (from the past 48 hour(s))  Protime-INR     Status: None   Collection Time: 10/20/17 11:30 PM  Result Value Ref Range   Prothrombin Time 13.6 11.4 - 15.2 seconds   INR 1.05   APTT     Status: None   Collection Time: 10/20/17 11:30 PM  Result Value Ref Range   aPTT 27 24 - 36 seconds  CBC     Status: Abnormal   Collection Time: 10/20/17 11:30 PM  Result Value Ref Range   WBC 8.4 4.0 - 10.5 K/uL    Comment: WHITE COUNT CONFIRMED ON SMEAR   RBC 4.05 3.87 - 5.11 MIL/uL   Hemoglobin 11.6 (L) 12.0 - 15.0 g/dL   HCT 35.5 (L) 36.0 -  46.0 %   MCV 87.7 78.0 - 100.0 fL   MCH 28.6 26.0 - 34.0 pg   MCHC 32.7 30.0 - 36.0 g/dL   RDW 14.2 11.5 - 15.5 %   Platelets 278 150 - 400 K/uL    Comment: SPECIMEN CHECKED FOR CLOTS PLATELET COUNT CONFIRMED BY SMEAR PLATELETS APPEAR ADEQUATE   Differential     Status: None   Collection Time: 10/20/17 11:30 PM  Result Value Ref Range   Neutrophils Relative % 51 %   Lymphocytes Relative 37 %   Monocytes Relative 9  %   Eosinophils Relative 3 %   Basophils Relative 0 %   Neutro Abs 4.2 1.7 - 7.7 K/uL   Lymphs Abs 3.1 0.7 - 4.0 K/uL   Monocytes Absolute 0.8 0.1 - 1.0 K/uL   Eosinophils Absolute 0.3 0.0 - 0.7 K/uL   Basophils Absolute 0.0 0.0 - 0.1 K/uL   WBC Morphology ATYPICAL LYMPHOCYTES   Comprehensive metabolic panel     Status: None   Collection Time: 10/20/17 11:30 PM  Result Value Ref Range   Sodium 136 135 - 145 mmol/L   Potassium 3.9 3.5 - 5.1 mmol/L   Chloride 107 101 - 111 mmol/L   CO2 22 22 - 32 mmol/L   Glucose, Bld 97 65 - 99 mg/dL   BUN 11 6 - 20 mg/dL   Creatinine, Ser 0.93 0.44 - 1.00 mg/dL   Calcium 9.4 8.9 - 10.3 mg/dL   Total Protein 7.4 6.5 - 8.1 g/dL   Albumin 4.0 3.5 - 5.0 g/dL   AST 27 15 - 41 U/L   ALT 33 14 - 54 U/L   Alkaline Phosphatase 51 38 - 126 U/L   Total Bilirubin 0.5 0.3 - 1.2 mg/dL   GFR calc non Af Amer >60 >60 mL/min   GFR calc Af Amer >60 >60 mL/min    Comment: (NOTE) The eGFR has been calculated using the CKD EPI equation. This calculation has not been validated in all clinical situations. eGFR's persistently <60 mL/min signify possible Chronic Kidney Disease.    Anion gap 7 5 - 15  Troponin I     Status: None   Collection Time: 10/20/17 11:30 PM  Result Value Ref Range   Troponin I <0.03 <0.03 ng/mL  Pregnancy, urine     Status: None   Collection Time: 10/20/17 11:30 PM  Result Value Ref Range   Preg Test, Ur NEGATIVE NEGATIVE    Comment:        THE SENSITIVITY OF THIS METHODOLOGY IS >20 mIU/mL.   CBG monitoring, ED     Status: None   Collection Time: 10/20/17 11:36 PM  Result Value Ref Range   Glucose-Capillary 92 65 - 99 mg/dL   Mr Brain Wo Contrast  Result Date: 10/21/2017 CLINICAL DATA:  42 y/o F; dizziness and left-sided weakness with right-sided headache. EXAM: MRI HEAD WITHOUT CONTRAST TECHNIQUE: Multiplanar, multiecho pulse sequences of the brain and surrounding structures were obtained without intravenous contrast. COMPARISON:   10/20/2016 CT head FINDINGS: Brain: No acute infarction, hemorrhage, hydrocephalus, extra-axial collection or mass lesion. Vascular: Normal flow voids. Skull and upper cervical spine: Normal marrow signal. Sinuses/Orbits: Negative. Other: None. IMPRESSION: Normal MRI of the brain. Electronically Signed   By: Kristine Garbe M.D.   On: 10/21/2017 05:10   Ct Head Code Stroke Wo Contrast`  Result Date: 10/21/2017 CLINICAL DATA:  Code stroke. 42 y/o F; right-sided headache and ataxia. EXAM: CT HEAD WITHOUT CONTRAST TECHNIQUE: Contiguous axial  images were obtained from the base of the skull through the vertex without intravenous contrast. COMPARISON:  None. FINDINGS: Brain: No evidence of acute infarction, hemorrhage, hydrocephalus, extra-axial collection or mass lesion/mass effect. Vascular: Mild calcific atherosclerosis of carotid siphons. Skull: Normal. Negative for fracture or focal lesion. Sinuses/Orbits: No acute finding. Other: None. ASPECTS [facility] Stroke Program Early CT Score) - Ganglionic level infarction (caudate, lentiform nuclei, internal capsule, insula, M1-M3 cortex): 7 - Supraganglionic infarction (M4-M6 cortex): 3 Total score (0-10 with 10 being normal): 10 IMPRESSION: 1. Negative CT of the head. 2. ASPECTS is 10 These results were called by telephone at the time of interpretation on 10/21/2017 at 12:10 am to Dr. Thayer Jew , who verbally acknowledged these results. Electronically Signed   By: Kristine Garbe M.D.   On: 10/21/2017 00:10    Assessment: 42 y.o. female presenting with headache and left sided weakness. Symptoms now resolved, with subtle residual lateralized deficits noted on Neurological exam.  1. MRI brain is normal.  2. DDx includes TIA and complicated migraine. 3. Stroke Risk Factors - DM 4. ABCD2 score is 5  Plan: 1. HgbA1c, fasting lipid panel 2. MRA of the brain without contrast with carotid ultrasound. Alternatively, a CTA of head and neck can be  obtained.  3. PT consult, OT consult, Speech consult 4. Echocardiogram 5. Hypercoagulable panel.  6. ASA 81 mg po qd 7. Continue statin 8. Regarding her diabetes, I have educated the patient regarding potential benefits of a gradual, controlled weight loss program focusing on maintaining a slight net caloric deficit over an extended period until weight and blood sugar goals are met, versus bariatric surgery  9. Telemetry monitoring 10. Frequent neuro checks 11. Permissive HTN. 12. IVF to reduce the chance of recrudescence of what may have been a complicated migraine headache 13. Phenergan or Compazine PRN  '@Electronically'  signed: Dr. Kerney Elbe 10/21/2017, 5:33 AM

## 2017-10-21 NOTE — Progress Notes (Signed)
  Echocardiogram 2D Echocardiogram has been performed.  Rhonda Le 10/21/2017, 10:31 AM

## 2017-10-21 NOTE — Care Management Note (Signed)
Case Management Note  Patient Details  Name: Rhonda Le MRN: [id number] Date of Birth: 1976-03-08  Subjective/Objective:   Pt in to r/o CVA. She is from home with her spouse. Pt does not have a PcP.                  Action/Plan: CM consulted for PCP. Pt interested in staying with the [facility] system. CM was able to obtain her an appointment at [facility]. Information on the AVS. No further needs per CM.   Expected Discharge Date:                  Expected Discharge Plan:  Home/Self Care  In-House Referral:     Discharge planning Services  CM Consult(PCP)  Post Acute Care Choice:    Choice offered to:     DME Arranged:    DME Agency:     HH Arranged:    HH Agency:     Status of Service:  Completed, signed off  If discussed at [employer] of Stay Meetings, dates discussed:    Additional Comments:  Pollie Friar, RN 10/21/2017, 4:50 PM

## 2017-10-21 NOTE — Consult Note (Signed)
[facility] TeleNeurology Consult Services  Asked to see this patient in telemedicine consultation. Consultation was performed with assistance of ancillary/medical staff at bedside.  Comments: Last Known normal 2200 Door Time: 2303 [facility] Contacted: 2347 [facility] first log in: 2355 NIHSS assessment time: 0005 Needle Time: no iv tpa Call back time: 0009  HPI:  21 yof with hx of diabetes since 2012 presents to hospital with headache and left sided weakness. She went to bed around 2200 and woke up with shortly thereafter with weakness on her left side. She denied any numbness but states arm and leg are weak but no altered speech or loss of vision. CT scan head was negative for acute process and presently she complains of moderate headache which is unusual for her. Husband states she is under intense stress related to her job and is reluctant to quit d/t financial reasons.   VSS  Gen Wn/Wd in Nad  TeleStroke Assessment: LOC:   0 LOC questions:  0 LOC Commands :   0 Gaze : 0 Visual fields :  0  Facial movements : 0 Upper limb Motor  0 Lower limb Motor  0 Limb Coordination  - 0 Sensory -  - 1 Language -  0 Speech -   0 Neglect / extinction -  0  NIHSS Score: 1    IMPRESSION  Migraine related to stress vs TIA/Stroke  Medical Decision Making:   Patient is not candidate for alteplase due to improved sxs with no focal weakness and mild numbness.  Patient refused alteplase after discussing risk / benefit / adverse effects.  Not an IR candidate as low clinical suspicion for LVO by neurologic assessment.   Recommendations: 1- Daily antithrombotics to initiate now if no contraindication treat headache with prn medications.  2- Further work up with Stroke labs, MRI brain, NIVS Carotid will be deferred to inpt neurology service 3-  Needs Inpatient Neurology consultation and follow up if admitted 4- Thank you for allowing [place] to participate in the care  of your patient, if there are any questions please don't hesitate to contact us  Discussed plan of care with patient/family/hospital staff   Physician: [name] Cheese, DO   [facility]

## 2017-10-22 DIAGNOSIS — R42 Dizziness and giddiness: Secondary | ICD-10-CM | POA: Diagnosis not present

## 2017-10-22 LAB — COMPREHENSIVE METABOLIC PANEL
ALBUMIN: 3.3 g/dL — AB (ref 3.5–5.0)
ALT: 26 U/L (ref 14–54)
ANION GAP: 6 (ref 5–15)
AST: 23 U/L (ref 15–41)
Alkaline Phosphatase: 37 U/L — ABNORMAL LOW (ref 38–126)
BILIRUBIN TOTAL: 0.7 mg/dL (ref 0.3–1.2)
BUN: 8 mg/dL (ref 6–20)
CO2: 24 mmol/L (ref 22–32)
Calcium: 8.4 mg/dL — ABNORMAL LOW (ref 8.9–10.3)
Chloride: 108 mmol/L (ref 101–111)
Creatinine, Ser: 0.77 mg/dL (ref 0.44–1.00)
GFR calc Af Amer: >60 mL/min (ref >60)
GFR calc non Af Amer: >60 mL/min (ref >60)
GLUCOSE: 100 mg/dL — AB (ref 65–99)
Potassium: 3.7 mmol/L (ref 3.5–5.1)
SODIUM: 138 mmol/L (ref 135–145)
TOTAL PROTEIN: 5.8 g/dL — AB (ref 6.5–8.1)

## 2017-10-22 LAB — CBC
HEMATOCRIT: 32 % — AB (ref 36.0–46.0)
Hemoglobin: 10.5 g/dL — ABNORMAL LOW (ref 12.0–15.0)
MCH: 28.9 pg (ref 26.0–34.0)
MCHC: 32.8 g/dL (ref 30.0–36.0)
MCV: 88.2 fL (ref 78.0–100.0)
Platelets: 261 10*3/uL (ref 150–400)
RBC: 3.63 MIL/uL — ABNORMAL LOW (ref 3.87–5.11)
RDW: 14.8 % (ref 11.5–15.5)
WBC: 6.7 10*3/uL (ref 4.0–10.5)

## 2017-10-22 LAB — GLUCOSE, CAPILLARY
Glucose-Capillary: 131 mg/dL — ABNORMAL HIGH (ref 65–99)
Glucose-Capillary: 80 mg/dL (ref 65–99)

## 2017-10-22 MED ORDER — ASPIRIN 81 MG PO CHEW: 81.0000 mg | CHEWABLE_TABLET | Freq: Every day | ORAL | Status: DC

## 2017-10-22 MED ORDER — ATORVASTATIN CALCIUM 40 MG PO TABS: 40.0000 mg | ORAL_TABLET | Freq: Every day | ORAL | 11 refills | Status: DC

## 2017-10-22 NOTE — Discharge Summary (Signed)
Physician Discharge Summary  Rhonda Le BMW:[id number] DOB: 1975/10/20 DOA: 10/20/2017  PCP: Patient, No Pcp Per  Admit date: 10/20/2017 Discharge date: 10/22/2017  Time spent: 20 minutes  Recommendations for Outpatient Follow-up:  1. lipitor added this admit-stop simvastatin--added asa 81 as well  Discharge Diagnoses:  Active Problems:   TIA (transient ischemic attack)   Anemia   Diabetes ([place])   Discharge Condition: improved  Diet recommendation: hh low salt  Filed Weights   10/20/17 2327  Weight: 104.3 kg (230 lb)    History of present illness:  18 blk fem diabetic admit with TIa like sympt c HA, L sid eweak and HA-work up didn't reveal any finding-exam inconsistent Seen by neruo-advised asa and High intensity statin No further work-up and d/c home without needs   Consultations:  neuro  Discharge Exam: Vitals:   10/22/17 0500 10/22/17 0937  BP: (!) 143/84 111/64  Pulse: 62 81  Resp: 16 18  Temp: 98.1 F (36.7 C) 98.1 F (36.7 C)  SpO2: 98% 98%    General: intact pleasan tin nad Cardiovascular: s1 s2 no m/r/g Respiratory: clear no added sound Neuro intact moving limbs x 4 without deficit smile symm no distress  Discharge Instructions   Discharge Instructions    Ambulatory referral to Neurology   Complete by:  As directed    An appointment is requested in approximately: 6 weeks Follow up with stroke clinic (Dr Leonie Man preferred, if not available, then consider Caesar Chestnut, [facility] or Jaynee Eagles whoever is available) at [facility] in about 6-8 weeks. Thanks.   Diet - low sodium heart healthy   Complete by:  As directed    Discharge instructions   Complete by:  As directed    follow with primary MD in 2-3 weeks Continue Lipitor 40 daily   Increase activity slowly   Complete by:  As directed      Allergies as of 10/22/2017   No Known Allergies     Medication List    STOP taking these medications   simvastatin 10 MG tablet Commonly known as:  ZOCOR      TAKE these medications   aspirin 81 MG chewable tablet Chew 1 tablet (81 mg total) by mouth daily. Start taking on:  10/23/2017   atorvastatin 40 MG tablet Commonly known as:  LIPITOR Take 1 tablet (40 mg total) by mouth daily.   metFORMIN 1000 MG tablet Commonly known as:  GLUCOPHAGE Take 500 mg by mouth 2 (two) times daily with a meal.      No Known Allergies Follow-up Information    Garvin Fila, MD. Schedule an appointment as soon as possible for a visit in 6 week(s).   Specialties:  Neurology, Radiology Contact information: [address] [place] [address] [phone]        Shelda Pal, DO Follow up on 10/31/2017.   Specialty:  Family Medicine Why:  Your appointment time is 10:00 am. please arrive 15 min early and bring: current medications and your insurance card Contact information: [place] [address] [phone]            The results of significant diagnostics from this hospitalization (including imaging, microbiology, ancillary and laboratory) are listed below for reference.    Significant Diagnostic Studies: Mr Virgel Paling JO Contrast  Result Date: 10/21/2017 CLINICAL DATA:  Followup dizziness with left-sided weakness and right-sided headache. EXAM: MRA NECK WITHOUT AND WITH CONTRAST MRA HEAD WITHOUT CONTRAST TECHNIQUE: Multiplanar  and multiecho pulse sequences of the neck were obtained without and with intravenous contrast. Angiographic images of the neck were obtained using MRA technique without and with intravenous contast.; Angiographic images of the Circle of Willis were obtained using MRA technique without intravenous contrast. CONTRAST:  19mL MULTIHANCE GADOBENATE DIMEGLUMINE 529 MG/ML IV SOLN COMPARISON:  MRI same day FINDINGS: MRA NECK FINDINGS Branching pattern of the brachiocephalic vessels from the arch is normal without origin stenosis. Both common carotid arteries are widely patent to  their respective bifurcation. Both carotid bifurcations are normal. No stenosis or irregularity. Both cervical internal carotid arteries are normal. Both vertebral arteries are patent at their origins and through the cervical region, through the foramen magnum to the basilar. MRA HEAD FINDINGS Both internal carotid arteries are widely patent through the skullbase and siphon regions. The anterior and middle cerebral vessels are normal without proximal stenosis, aneurysm or vascular malformation. Both vertebral arteries are widely patent to the basilar. No basilar stenosis. Posterior circulation branch vessels are normal. IMPRESSION: Normal MR angiography of the neck vessels and intracranial vessels. Electronically Signed   By: Nelson Chimes M.D.   On: 10/21/2017 09:55   Mr Jodene Nam Neck W Wo Contrast  Result Date: 10/21/2017 CLINICAL DATA:  Followup dizziness with left-sided weakness and right-sided headache. EXAM: MRA NECK WITHOUT AND WITH CONTRAST MRA HEAD WITHOUT CONTRAST TECHNIQUE: Multiplanar and multiecho pulse sequences of the neck were obtained without and with intravenous contrast. Angiographic images of the neck were obtained using MRA technique without and with intravenous contast.; Angiographic images of the Circle of Willis were obtained using MRA technique without intravenous contrast. CONTRAST:  28mL MULTIHANCE GADOBENATE DIMEGLUMINE 529 MG/ML IV SOLN COMPARISON:  MRI same day FINDINGS: MRA NECK FINDINGS Branching pattern of the brachiocephalic vessels from the arch is normal without origin stenosis. Both common carotid arteries are widely patent to their respective bifurcation. Both carotid bifurcations are normal. No stenosis or irregularity. Both cervical internal carotid arteries are normal. Both vertebral arteries are patent at their origins and through the cervical region, through the foramen magnum to the basilar. MRA HEAD FINDINGS Both internal carotid arteries are widely patent through the  skullbase and siphon regions. The anterior and middle cerebral vessels are normal without proximal stenosis, aneurysm or vascular malformation. Both vertebral arteries are widely patent to the basilar. No basilar stenosis. Posterior circulation branch vessels are normal. IMPRESSION: Normal MR angiography of the neck vessels and intracranial vessels. Electronically Signed   By: Nelson Chimes M.D.   On: 10/21/2017 09:55   Mr Brain Wo Contrast  Result Date: 10/21/2017 CLINICAL DATA:  42 y/o F; dizziness and left-sided weakness with right-sided headache. EXAM: MRI HEAD WITHOUT CONTRAST TECHNIQUE: Multiplanar, multiecho pulse sequences of the brain and surrounding structures were obtained without intravenous contrast. COMPARISON:  10/20/2016 CT head FINDINGS: Brain: No acute infarction, hemorrhage, hydrocephalus, extra-axial collection or mass lesion. Vascular: Normal flow voids. Skull and upper cervical spine: Normal marrow signal. Sinuses/Orbits: Negative. Other: None. IMPRESSION: Normal MRI of the brain. Electronically Signed   By: Kristine Garbe M.D.   On: 10/21/2017 05:10   Ct Head Code Stroke Wo Contrast`  Result Date: 10/21/2017 CLINICAL DATA:  Code stroke. 42 y/o F; right-sided headache and ataxia. EXAM: CT HEAD WITHOUT CONTRAST TECHNIQUE: Contiguous axial images were obtained from the base of the skull through the vertex without intravenous contrast. COMPARISON:  None. FINDINGS: Brain: No evidence of acute infarction, hemorrhage, hydrocephalus, extra-axial collection or mass lesion/mass effect. Vascular: Mild calcific atherosclerosis  of carotid siphons. Skull: Normal. Negative for fracture or focal lesion. Sinuses/Orbits: No acute finding. Other: None. ASPECTS [facility] Stroke Program Early CT Score) - Ganglionic level infarction (caudate, lentiform nuclei, internal capsule, insula, M1-M3 cortex): 7 - Supraganglionic infarction (M4-M6 cortex): 3 Total score (0-10 with 10 being normal): 10  IMPRESSION: 1. Negative CT of the head. 2. ASPECTS is 10 These results were called by telephone at the time of interpretation on 10/21/2017 at 12:10 am to Dr. Thayer Jew , who verbally acknowledged these results. Electronically Signed   By: Kristine Garbe M.D.   On: 10/21/2017 00:10    Microbiology: No results found for this or any previous visit (from the past 240 hour(s)).   Labs: Basic Metabolic Panel: Recent Labs  Lab 10/20/17 2330 10/22/17 0419  NA 136 138  K 3.9 3.7  CL 107 108  CO2 22 24  GLUCOSE 97 100*  BUN 11 8  CREATININE 0.93 0.77  CALCIUM 9.4 8.4*   Liver Function Tests: Recent Labs  Lab 10/20/17 2330 10/22/17 0419  AST 27 23  ALT 33 26  ALKPHOS 51 37*  BILITOT 0.5 0.7  PROT 7.4 5.8*  ALBUMIN 4.0 3.3*   No results for input(s): LIPASE, AMYLASE in the last 168 hours. No results for input(s): AMMONIA in the last 168 hours. CBC: Recent Labs  Lab 10/20/17 2330 10/22/17 0419  WBC 8.4 6.7  NEUTROABS 4.2  --   HGB 11.6* 10.5*  HCT 35.5* 32.0*  MCV 87.7 88.2  PLT 278 261   Cardiac Enzymes: Recent Labs  Lab 10/20/17 2330  TROPONINI <0.03   BNP: BNP (last 3 results) No results for input(s): BNP in the last 8760 hours.  ProBNP (last 3 results) No results for input(s): PROBNP in the last 8760 hours.  CBG: Recent Labs  Lab 10/21/17 1209 10/21/17 1715 10/21/17 2104 10/22/17 0724 10/22/17 1130  GLUCAP 127* 123* 148* 131* 80       Signed:  Nita Sells MD   [facility] 10/22/2017, 12:50 PM

## 2017-10-31 ENCOUNTER — Encounter: Payer: Self-pay | Admitting: Family Medicine

## 2017-10-31 ENCOUNTER — Ambulatory Visit (INDEPENDENT_AMBULATORY_CARE_PROVIDER_SITE_OTHER): Payer: BLUE CROSS/BLUE SHIELD | Admitting: Family Medicine

## 2017-10-31 VITALS — BP 140/82 | HR 101 | Temp 98.2°F | Ht 68.0 in | Wt 235.5 lb

## 2017-10-31 DIAGNOSIS — G459 Transient cerebral ischemic attack, unspecified: Secondary | ICD-10-CM | POA: Diagnosis not present

## 2017-10-31 DIAGNOSIS — R030 Elevated blood-pressure reading, without diagnosis of hypertension: Secondary | ICD-10-CM

## 2017-10-31 DIAGNOSIS — Z23 Encounter for immunization: Secondary | ICD-10-CM

## 2017-10-31 NOTE — Progress Notes (Signed)
Pre visit review using our clinic review tool, if applicable. No additional management support is needed unless otherwise documented below in the visit note. 

## 2017-10-31 NOTE — Patient Instructions (Addendum)
Around 3 times per week, check your blood pressure 4 times per day. Twice in the morning and twice in the evening. The readings should be at least one minute apart. Write down these values and bring them to your next nurse visit/appointment.  When you check your BP, make sure you have been doing something calm/relaxing 5 minutes prior to checking. Both feet should be flat on the floor and you should be sitting. Use your left arm and make sure it is in a relaxed position (on a table), and that the cuff is at the approximate level/height of your heart.  Sleep Hygiene Tips:  Do not watch TV or look at screens within 1 hour of going to bed. If you do, make sure there is a blue light filter (nighttime mode) involved.  Try to go to bed around the same time every night. Wake up at the same time within 1 hour of regular time. Ex: If you wake up at 7 AM for work, do not sleep past 8 AM on days that you don't work.  Do not drink alcohol before bedtime.  Do not consume caffeine-containing beverages after noon or within 9 hours of intended bedtime.  Get regular exercise/physical activity in your life, but not within 2 hours of planned bedtime.  Do not take naps.   Do not eat within 2 hours of planned bedtime.  Melatonin, 3-5 mg 30-60 minutes before planned bedtime may be helpful.   The bed should be for sleep or sex only. If after 20-30 minutes you are unable to fall asleep, get up and do something relaxing. Do this until you feel ready to go to sleep again.   Keep up the good work with your exercise regimen and healthy eating.   Let us know if you need anything.

## 2017-10-31 NOTE — Progress Notes (Signed)
Chief Complaint  Patient presents with  . Establish Care       New Patient Visit SUBJECTIVE: HPI: Rhonda Le is an 42 y.o.female who is being seen for establishing care.  The patient had not previously had a pcp in area, used to bee seen in [place].   The patient was recently discharged on 10/20/17 - 10/22/17 for TIA.  Her symptoms resolved w/in 24 hours and her stroke work up was neg. She is not having symptoms. She was discharged on ASA 81 and Lipitor, tolerating these well. Her mother had as stroke that she ultimately passed away from, and pt would like to minimize her risk as much as possible.   Pt has hx of DM, diet controlled also. No insulin. Last eye exam was mid 2018, has had flu shot, never had pcv.  Reports diet healthy overall, exercises routinely.  She does not have a hx of elevated BP. She does not ck her BP's at home.  No Known Allergies  Past Medical History:  Diagnosis Date  . Diabetes mellitus without complication [facility])    Past Surgical History:  Procedure Laterality Date  . BREAST REDUCTION SURGERY  2004  . TUBAL LIGATION  2003   Social History   Socioeconomic History  . Marital status: Married  Tobacco Use  . Smoking status: Never Smoker  . Smokeless tobacco: Never Used  Substance and Sexual Activity  . Alcohol use: Yes    Frequency: Never  . Drug use: No   Family History  Problem Relation Age of Onset  . Stroke Mother   . Stroke Maternal Aunt      Current Outpatient Medications:  .  aspirin 81 MG chewable tablet, Chew 1 tablet (81 mg total) by mouth daily., Disp: , Rfl:  .  atorvastatin (LIPITOR) 40 MG tablet, Take 1 tablet (40 mg total) by mouth daily., Disp: 30 tablet, Rfl: 11 .  metFORMIN (GLUCOPHAGE) 1000 MG tablet, Take 500 mg by mouth 2 (two) times daily with a meal., Disp: , Rfl:   Patient's last menstrual period was 10/19/2017 (exact date).  ROS Cardiovascular: Denies chest pain  Respiratory: Denies dyspnea  Constitutional: No  fevers Neuro: No weakness Psych: +anxiety (related to work) Skin: No rashes GI: No abdominal pain GU: No urinary complaints Eyes: No vision changes Heme: No easy bruising/bleeding  OBJECTIVE: BP 140/82 (BP Location: Left Arm, Patient Position: Sitting, Cuff Size: Normal)   Pulse (!) 101   Temp 98.2 F (36.8 C) (Oral)   Ht 5\' 8"  (1.727 m)   Wt 235 lb 8 oz (106.8 kg)   LMP 10/19/2017 (Exact Date)   SpO2 97%   BMI 35.81 kg/m   Constitutional: -  VS reviewed -  Well developed, well nourished, appears stated age -  No apparent distress  Psychiatric: -  Oriented to person, place, and time -  Memory intact -  Affect and mood normal -  Fluent conversation, good eye contact -  Judgment and insight age appropriate  Eye: -  Conjunctivae clear, no discharge -  Pupils symmetric, round, reactive to light  ENMT: -  MMM    Pharynx moist, no exudate, no erythema  Neck: -  No gross swelling, no palpable masses -  Thyroid midline, not enlarged, mobile, no palpable masses  Cardiovascular: -  RRR -  No LE edema  Respiratory: -  Normal respiratory effort, no accessory muscle use, no retraction -  Breath sounds equal, no wheezes, no ronchi, no crackles  Gastrointestinal: -  Bowel sounds normal -  No tenderness, no distention, no guarding, no masses  Neurological:  -  CN II - XII grossly intact -  Sensation grossly intact to light touch, equal bilaterally  Musculoskeletal: -  No clubbing, no cyanosis -  Gait normal  Skin: -  No significant lesion on inspection -  Warm and dry to palpation   ASSESSMENT/PLAN: TIA (transient ischemic attack)  Elevated blood pressure reading  Need for vaccination against Streptococcus pneumoniae - Plan: Pneumococcal polysaccharide vaccine 23-valent greater than or equal to 2yo subcutaneous/IM  Patient instructed to sign release of records form from her previous PCP. Discharge summary reviewed.  Stay on Lipitor and baby aspirin. I certify that the  discharge follow-up took place within 14 days of departing the hospital.  Level of decision making is moderate. Counseled on diet and exercise.  She is doing well.  Home blood pressure monitoring discussed. Patient should return in 6 weeks to recheck blood pressure. The patient voiced understanding and agreement to the plan.   [place], DO 10/31/17  11:46 AM

## 2017-12-12 ENCOUNTER — Encounter: Payer: Self-pay | Admitting: Family Medicine

## 2017-12-12 ENCOUNTER — Ambulatory Visit (INDEPENDENT_AMBULATORY_CARE_PROVIDER_SITE_OTHER): Payer: BLUE CROSS/BLUE SHIELD | Admitting: Family Medicine

## 2017-12-12 VITALS — BP 132/80 | HR 91 | Temp 98.5°F | Ht 68.0 in | Wt 234.0 lb

## 2017-12-12 DIAGNOSIS — E1159 Type 2 diabetes mellitus with other circulatory complications: Secondary | ICD-10-CM

## 2017-12-12 DIAGNOSIS — I10 Essential (primary) hypertension: Secondary | ICD-10-CM | POA: Insufficient documentation

## 2017-12-12 DIAGNOSIS — I152 Hypertension secondary to endocrine disorders: Secondary | ICD-10-CM

## 2017-12-12 LAB — BASIC METABOLIC PANEL
BUN: 7 mg/dL (ref 6–23)
CALCIUM: 9.5 mg/dL (ref 8.4–10.5)
CHLORIDE: 105 meq/L (ref 96–112)
CO2: 24 meq/L (ref 19–32)
Creatinine, Ser: 0.75 mg/dL (ref 0.40–1.20)
GFR: 107.94 mL/min (ref >60.00)
Glucose, Bld: 106 mg/dL — ABNORMAL HIGH (ref 70–99)
POTASSIUM: 3.9 meq/L (ref 3.5–5.1)
SODIUM: 137 meq/L (ref 135–145)

## 2017-12-12 MED ORDER — LISINOPRIL 10 MG PO TABS: 10.0000 mg | ORAL_TABLET | Freq: Every day | ORAL | 2 refills | Status: DC

## 2017-12-12 NOTE — Patient Instructions (Addendum)
Keep up the good work.   Stretch!  Check blood pressures 2 times per day, you no longer need to wait 1 minute between readings. Check 2 times per week, continue to write them down.  Goal: Top number 130 or less, bottom number <80.  Let us know if you need anything.

## 2017-12-12 NOTE — Progress Notes (Signed)
Pre visit review using our clinic review tool, if applicable. No additional management support is needed unless otherwise documented below in the visit note. 

## 2017-12-12 NOTE — Progress Notes (Signed)
Chief Complaint  Patient presents with  . Hypertension    Subjective Rhonda Le is a 42 y.o. female who presents for hypertension follow up. She does monitor home blood pressures. Blood pressures ranging from 110-130's/70-80's on average. She is compliant with medications. Patient has these side effects of medication: none She is adhering to a healthy diet overall. Current exercise: cardio and some weights   Past Medical History:  Diagnosis Date  . Diabetes mellitus without complication ([place])    Family History  Problem Relation Age of Onset  . Stroke Mother   . Heart disease Mother   . Depression Mother   . Stroke Maternal Aunt   . Depression Father   . High Cholesterol Father   . High blood pressure Father     Review of Systems Cardiovascular: no chest pain Respiratory:  no shortness of breath  Exam BP 132/80 (BP Location: Left Arm, Patient Position: Sitting, Cuff Size: Large)   Pulse 91   Temp 98.5 F (36.9 C) (Oral)   Ht 5\' 8"  (1.727 m)   Wt 234 lb (106.1 kg)   SpO2 97%   BMI 35.58 kg/m  General:  well developed, well nourished, in no apparent distress Skin: warm, no pallor or diaphoresis Eyes: pupils equal and round, sclera anicteric without injection Heart: RRR, no bruits, no LE edema Lungs: clear to auscultation, no accessory muscle use Psych: well oriented with normal range of affect and appropriate judgment/insight  Hypertension associated with diabetes ([place]) - Plan: lisinopril (PRINIVIL,ZESTRIL) 10 MG tablet, Basic metabolic panel, Basic metabolic panel  Orders as above. CK labs today and in 1 week. Monitor BP's twice weekly, no need to wait 1 min between readings anymore.  Counseled on diet and exercise- she is doing well.  Her goal is 130 or less SBP and <80 DBP.  F/u in 6 weeks. The patient voiced understanding and agreement to the plan.  [place], DO 12/12/17  10:32 AM

## 2017-12-19 ENCOUNTER — Other Ambulatory Visit: Payer: BLUE CROSS/BLUE SHIELD

## 2017-12-19 ENCOUNTER — Other Ambulatory Visit (INDEPENDENT_AMBULATORY_CARE_PROVIDER_SITE_OTHER): Payer: BLUE CROSS/BLUE SHIELD

## 2017-12-19 DIAGNOSIS — E1159 Type 2 diabetes mellitus with other circulatory complications: Secondary | ICD-10-CM | POA: Diagnosis not present

## 2017-12-19 DIAGNOSIS — I152 Hypertension secondary to endocrine disorders: Secondary | ICD-10-CM

## 2017-12-19 DIAGNOSIS — I10 Essential (primary) hypertension: Secondary | ICD-10-CM

## 2017-12-19 LAB — BASIC METABOLIC PANEL WITH GFR
BUN: 8 mg/dL (ref 6–23)
CO2: 24 meq/L (ref 19–32)
Calcium: 9.6 mg/dL (ref 8.4–10.5)
Chloride: 106 meq/L (ref 96–112)
Creatinine, Ser: 0.75 mg/dL (ref 0.40–1.20)
GFR: 107.93 mL/min
Glucose, Bld: 104 mg/dL — ABNORMAL HIGH (ref 70–99)
Potassium: 4 meq/L (ref 3.5–5.1)
Sodium: 136 meq/L (ref 135–145)

## 2017-12-24 ENCOUNTER — Ambulatory Visit (INDEPENDENT_AMBULATORY_CARE_PROVIDER_SITE_OTHER): Payer: BLUE CROSS/BLUE SHIELD | Admitting: Neurology

## 2017-12-24 ENCOUNTER — Encounter: Payer: Self-pay | Admitting: Neurology

## 2017-12-24 VITALS — BP 120/80 | HR 80 | Ht 68.0 in | Wt 230.0 lb

## 2017-12-24 DIAGNOSIS — G459 Transient cerebral ischemic attack, unspecified: Secondary | ICD-10-CM

## 2017-12-24 NOTE — Progress Notes (Signed)
[facility] [address] [place]. [place] [address] [phone]       OFFICE FOLLOW-UP NOTE  [name] Date of Birth:  26-Dec-1975 Medical Record Number:  [id number]   HPI: 63 year African-American lady seen today for first office follow-up visit following hospital admission for TIA in January 2019. History is obtained from the patient and review of electronic medical records. I have personally reviewed imaging films.Rhonda Baileyis an 42 y.o.femalewho initially presented to [facility] on Sunday night with acute onset of headache, dizziness and left sided weakness. A Code Stroke was initiated and she was evaluated by Teleneurology. She had an NIHSS of 1 with a somewhat inconsistent exam. Teleneurologist discussed tPA with the patient and she declined tPA. She was transferred to [facility] for stroke admission.  Teleneurology note reviewed: "87 yof with hx of diabetes since 2012 presents to hospital with headache and left sided weakness. She went to bed around 2200 and woke up with shortly thereafter with weakness on her left side. She denied any numbness but states arm and leg are weak but no altered speech or loss of vision. CT scan head was negative for acute process and presently she complains of moderate headache which is unusual for her. Husband states she is under intense stress related to her job and is reluctant to quit d/t financial reasons. The patient's symptoms completely resolved shortly after admission. MRI scan of the brain showed no acute abnormality. MRA of the brain and neck did not show any significant intracranial or extracranial stenosis or occlusion. Transthoracic echo showed normal ejection fraction without cardiac source of embolism. LDL cholesterol was elevated at 87 mg percent and hemoglobin A1c was 6.1. Patient was started on aspirin for stroke prevention and Lipitor. She states she's done well since discharge. She has had no  recurrent headaches or dizzy episodes. She is tolerating aspirin well without bleeding or bruising. She does complain of some muscle aches on Lipitor which is otherwise tolerating well. Her blood pressure is under good control. Her fasting sugars have also been quite good. She has no new complaints today. She does give a remote history of migraine headaches but states of late she has not had any increase in migraine frequency.    ROS:   14 system review of systems is positive for  headache, dizziness, weakness and all the systems negative PMH:  Past Medical History:  Diagnosis Date  . Diabetes mellitus without complication ([place])   . Hypertension   . Stroke [facility])     Social History:  Social History   Socioeconomic History  . Marital status: Married    Spouse name: Not on file  . Number of children: Not on file  . Years of education: Not on file  . Highest education level: Not on file  Social Needs  . Financial resource strain: Not on file  . Food insecurity - worry: Not on file  . Food insecurity - inability: Not on file  . Transportation needs - medical: Not on file  . Transportation needs - non-medical: Not on file  Occupational History  . Not on file  Tobacco Use  . Smoking status: Never Smoker  . Smokeless tobacco: Never Used  Substance and Sexual Activity  . Alcohol use: Yes    Alcohol/week: 0.6 oz    Types: 1 Glasses of wine per week    Frequency: Never    Comment: drink evey two weeks  . Drug use: No  . Sexual activity:  Yes  Other Topics Concern  . Not on file  Social History Narrative  . Not on file    Medications:   Current Outpatient Medications on File Prior to Visit  Medication Sig Dispense Refill  . aspirin 81 MG chewable tablet Chew 1 tablet (81 mg total) by mouth daily.    Marland Kitchen atorvastatin (LIPITOR) 40 MG tablet Take 1 tablet (40 mg total) by mouth daily. 30 tablet 11  . lisinopril (PRINIVIL,ZESTRIL) 10 MG tablet Take 1 tablet (10 mg total) by mouth  daily. 30 tablet 2  . metFORMIN (GLUCOPHAGE) 1000 MG tablet Take 500 mg by mouth 2 (two) times daily with a meal.     No current facility-administered medications on file prior to visit.     Allergies:  No Known Allergies  Physical Exam General: Obese middle-age [place] American lady, seated, in no evident distress Head: head normocephalic and atraumatic.  Neck: supple with no carotid or supraclavicular bruits Cardiovascular: regular rate and rhythm, no murmurs Musculoskeletal: no deformity Skin:  no rash/petichiae Vascular:  Normal pulses all extremities Vitals:   12/24/17 0849  BP: 120/80  Pulse: 80   Neurologic Exam Mental Status: Awake and fully alert. Oriented to place and time. Recent and remote memory intact. Attention span, concentration and fund of knowledge appropriate. Mood and affect appropriate.  Cranial Nerves: Fundoscopic exam reveals sharp disc margins. Pupils equal, briskly reactive to light. Extraocular movements full without nystagmus. Visual fields full to confrontation. Hearing intact. Facial sensation intact. Face, tongue, palate moves normally and symmetrically.  Motor: Normal bulk and tone. Normal strength in all tested extremity muscles. Sensory.: intact to touch ,pinprick .position and vibratory sensation.  Coordination: Rapid alternating movements normal in all extremities. Finger-to-nose and heel-to-shin performed accurately bilaterally. Gait and Station: Arises from chair without difficulty. Stance is normal. Gait demonstrates normal stride length and balance . Able to heel, toe and tandem walk without difficulty.  Reflexes: 1+ and symmetric. Toes downgoing.   NIHSS  0 Modified [place]in  0   ASSESSMENT: 51 year lady with transient episode of headache dizziness left-sided weakness possible TIA versus complicated migraine episode in January 2019. Vascular risk factors of hypertension, hyperlipidemia, diabetes and obesity.    PLAN: I had a long d/w  patient about her recent TIA versus complicated migraine episode, risk for recurrent stroke/TIAs, personally independently reviewed imaging studies and stroke evaluation results and answered questions.Continue aspirin 81 mg daily  for secondary stroke prevention and maintain strict control of hypertension with blood pressure goal below 130/90, diabetes with hemoglobin A1c goal below 6.5% and lipids with LDL cholesterol goal below 70 mg/dL. I also advised the patient to eat a healthy diet with plenty of whole grains, cereals, fruits and vegetables, exercise regularly and maintain ideal body weight Followup in the future with my nurse practitioner Janett Billow in 6 months or call earlier if necessary Greater than 50% of time during this 25 minute visit was spent on counseling,explanation of diagnosis of TIA versus completed migraine planning of further management, discussion with patient and family and coordination of care Antony Contras, MD  [facility] [address] [place], [place] [address]  Phone [phone] Fax [phone] Note: This document was prepared with digital dictation and possible smart phrase technology. Any transcriptional errors that result from this process are unintentional

## 2017-12-24 NOTE — Patient Instructions (Signed)
I had a long d/w patient about her recent TIA versus complicated migraine episode, risk for recurrent stroke/TIAs, personally independently reviewed imaging studies and stroke evaluation results and answered questions.Continue aspirin 81 mg daily  for secondary stroke prevention and maintain strict control of hypertension with blood pressure goal below 130/90, diabetes with hemoglobin A1c goal below 6.5% and lipids with LDL cholesterol goal below 70 mg/dL. I also advised the patient to eat a healthy diet with plenty of whole grains, cereals, fruits and vegetables, exercise regularly and maintain ideal body weight Followup in the future with my nurse practitioner Janett Billow in 6 months or call earlier if necessary  Stroke Prevention Some medical conditions and behaviors are associated with a higher chance of having a stroke. You can help prevent a stroke by making nutrition, lifestyle, and other changes, including managing any medical conditions you may have. What nutrition changes can be made?  Eat healthy foods. You can do this by: ? Choosing foods high in fiber, such as fresh fruits and vegetables and whole grains. ? Eating at least 5 or more servings of fruits and vegetables a day. Try to fill half of your plate at each meal with fruits and vegetables. ? Choosing lean protein foods, such as lean cuts of meat, poultry without skin, fish, tofu, beans, and nuts. ? Eating low-fat dairy products. ? Avoiding foods that are high in salt (sodium). This can help lower blood pressure. ? Avoiding foods that have saturated fat, trans fat, and cholesterol. This can help prevent high cholesterol. ? Avoiding processed and premade foods.  Follow your health care provider's specific guidelines for losing weight, controlling high blood pressure (hypertension), lowering high cholesterol, and managing diabetes. These may include: ? Reducing your daily calorie intake. ? Limiting your daily sodium intake to 1,500  milligrams (mg). ? Using only healthy fats for cooking, such as olive oil, canola oil, or sunflower oil. ? Counting your daily carbohydrate intake. What lifestyle changes can be made?  Maintain a healthy weight. Talk to your health care provider about your ideal weight.  Get at least 30 minutes of moderate physical activity at least 5 days a week. Moderate activity includes brisk walking, biking, and swimming.  Do not use any products that contain nicotine or tobacco, such as cigarettes and e-cigarettes. If you need help quitting, ask your health care provider. It may also be helpful to avoid exposure to secondhand smoke.  Limit alcohol intake to no more than 1 drink a day for nonpregnant women and 2 drinks a day for men. One drink equals 12 oz of beer, 5 oz of wine, or 1 oz of hard liquor.  Stop any illegal drug use.  Avoid taking birth control pills. Talk to your health care provider about the risks of taking birth control pills if: ? You are over 3 years old. ? You smoke. ? You get migraines. ? You have ever had a blood clot. What other changes can be made?  Manage your cholesterol levels. ? Eating a healthy diet is important for preventing high cholesterol. If cholesterol cannot be managed through diet alone, you may also need to take medicines. ? Take any prescribed medicines to control your cholesterol as told by your health care provider.  Manage your diabetes. ? Eating a healthy diet and exercising regularly are important parts of managing your blood sugar. If your blood sugar cannot be managed through diet and exercise, you may need to take medicines. ? Take any prescribed medicines to control  your diabetes as told by your health care provider.  Control your hypertension. ? To reduce your risk of stroke, try to keep your blood pressure below 130/80. ? Eating a healthy diet and exercising regularly are an important part of controlling your blood pressure. If your blood  pressure cannot be managed through diet and exercise, you may need to take medicines. ? Take any prescribed medicines to control hypertension as told by your health care provider. ? Ask your health care provider if you should monitor your blood pressure at home. ? Have your blood pressure checked every year, even if your blood pressure is normal. Blood pressure increases with age and some medical conditions.  Get evaluated for sleep disorders (sleep apnea). Talk to your health care provider about getting a sleep evaluation if you snore a lot or have excessive sleepiness.  Take over-the-counter and prescription medicines only as told by your health care provider. Aspirin or blood thinners (antiplatelets or anticoagulants) may be recommended to reduce your risk of forming blood clots that can lead to stroke.  Make sure that any other medical conditions you have, such as atrial fibrillation or atherosclerosis, are managed. What are the warning signs of a stroke? The warning signs of a stroke can be easily remembered as BEFAST.  B is for balance. Signs include: ? Dizziness. ? Loss of balance or coordination. ? Sudden trouble walking.  E is for eyes. Signs include: ? A sudden change in vision. ? Trouble seeing.  F is for face. Signs include: ? Sudden weakness or numbness of the face. ? The face or eyelid drooping to one side.  A is for arms. Signs include: ? Sudden weakness or numbness of the arm, usually on one side of the body.  S is for speech. Signs include: ? Trouble speaking (aphasia). ? Trouble understanding.  T is for time. ? These symptoms may represent a serious problem that is an emergency. Do not wait to see if the symptoms will go away. Get medical help right away. Call your local emergency services (911 in the [place] Do not drive yourself to the hospital.  Other signs of stroke may include: ? A sudden, severe headache with no known cause. ? Nausea or  vomiting. ? Seizure.  Where to find more information: For more information, visit:  American Stroke Association: www.strokeassociation.org  National Stroke Association: www.stroke.org  Summary  You can prevent a stroke by eating healthy, exercising, not smoking, limiting alcohol intake, and managing any medical conditions you may have.  Do not use any products that contain nicotine or tobacco, such as cigarettes and e-cigarettes. If you need help quitting, ask your health care provider. It may also be helpful to avoid exposure to secondhand smoke.  Remember BEFAST for warning signs of stroke. Get help right away if you or a loved one has any of these signs. This information is not intended to replace advice given to you by your health care provider. Make sure you discuss any questions you have with your health care provider. Document Released: 11/08/2004 Document Revised: 11/06/2016 Document Reviewed: 11/06/2016 Elsevier Interactive Patient Education  [employer].

## 2018-01-08 ENCOUNTER — Encounter: Payer: Self-pay | Admitting: Adult Health

## 2018-01-24 ENCOUNTER — Ambulatory Visit (INDEPENDENT_AMBULATORY_CARE_PROVIDER_SITE_OTHER): Payer: BLUE CROSS/BLUE SHIELD | Admitting: Family Medicine

## 2018-01-24 ENCOUNTER — Encounter: Payer: Self-pay | Admitting: Family Medicine

## 2018-01-24 VITALS — BP 112/72 | HR 79 | Temp 98.9°F | Ht 68.0 in | Wt 235.0 lb

## 2018-01-24 DIAGNOSIS — I152 Hypertension secondary to endocrine disorders: Secondary | ICD-10-CM

## 2018-01-24 DIAGNOSIS — I10 Essential (primary) hypertension: Secondary | ICD-10-CM | POA: Diagnosis not present

## 2018-01-24 DIAGNOSIS — E1159 Type 2 diabetes mellitus with other circulatory complications: Secondary | ICD-10-CM | POA: Diagnosis not present

## 2018-01-24 NOTE — Patient Instructions (Signed)
Keep up the good work.  You don't need to check your blood pressures at home anymore, unless you want.  Let us know if you need anything.

## 2018-01-24 NOTE — Progress Notes (Signed)
Chief Complaint  Patient presents with  . Follow-up    Subjective Rhonda Le is a 42 y.o. female who presents for hypertension follow up. She does monitor home blood pressures. Blood pressures ranging from 110-130's/70-80's on average- She is compliant with medication- lisinopril 10 mg/d. Patient has these side effects of medication: none She is adhering to a healthy diet overall. Current exercise: walking, treadmill, elliptical   Past Medical History:  Diagnosis Date  . Diabetes mellitus without complication ([place])   . Hypertension   . Stroke [facility])    Review of Systems Cardiovascular: no chest pain Respiratory:  no shortness of breath  Exam BP 112/72 (BP Location: Left Arm, Patient Position: Sitting, Cuff Size: Large)   Pulse 79   Temp 98.9 F (37.2 C) (Oral)   Ht 5\' 8"  (1.727 m)   Wt 235 lb (106.6 kg)   SpO2 97%   BMI 35.73 kg/m  General:  well developed, well nourished, in no apparent distress Skin: warm, no pallor or diaphoresis Eyes: pupils equal and round, sclera anicteric without injection Heart: RRR, no bruits, no LE edema Lungs: clear to auscultation, no accessory muscle use Psych: well oriented with normal range of affect and appropriate judgment/insight  Hypertension associated with diabetes ([facility])  Cont ACEi. Counseled on diet and exercise F/u in 3 mo for CPE. The patient voiced understanding and agreement to the plan.  [place], DO 01/24/18  2:14 PM

## 2018-01-24 NOTE — Progress Notes (Signed)
Pre visit review using our clinic review tool, if applicable. No additional management support is needed unless otherwise documented below in the visit note. 

## 2018-03-11 ENCOUNTER — Other Ambulatory Visit: Payer: Self-pay | Admitting: Family Medicine

## 2018-03-11 DIAGNOSIS — I152 Hypertension secondary to endocrine disorders: Secondary | ICD-10-CM

## 2018-03-11 DIAGNOSIS — I10 Essential (primary) hypertension: Principal | ICD-10-CM

## 2018-03-11 DIAGNOSIS — E1159 Type 2 diabetes mellitus with other circulatory complications: Secondary | ICD-10-CM

## 2018-04-10 ENCOUNTER — Other Ambulatory Visit: Payer: Self-pay | Admitting: Family Medicine

## 2018-04-10 DIAGNOSIS — I152 Hypertension secondary to endocrine disorders: Secondary | ICD-10-CM

## 2018-04-10 DIAGNOSIS — E1159 Type 2 diabetes mellitus with other circulatory complications: Secondary | ICD-10-CM

## 2018-04-10 DIAGNOSIS — I10 Essential (primary) hypertension: Principal | ICD-10-CM

## 2018-04-25 ENCOUNTER — Encounter: Payer: BLUE CROSS/BLUE SHIELD | Admitting: Family Medicine

## 2018-05-09 ENCOUNTER — Other Ambulatory Visit: Payer: Self-pay | Admitting: Family Medicine

## 2018-05-09 DIAGNOSIS — I152 Hypertension secondary to endocrine disorders: Secondary | ICD-10-CM

## 2018-05-09 DIAGNOSIS — I10 Essential (primary) hypertension: Principal | ICD-10-CM

## 2018-05-09 DIAGNOSIS — E1159 Type 2 diabetes mellitus with other circulatory complications: Secondary | ICD-10-CM

## 2018-06-05 ENCOUNTER — Other Ambulatory Visit: Payer: Self-pay | Admitting: Family Medicine

## 2018-06-05 DIAGNOSIS — I152 Hypertension secondary to endocrine disorders: Secondary | ICD-10-CM

## 2018-06-05 DIAGNOSIS — I10 Essential (primary) hypertension: Principal | ICD-10-CM

## 2018-06-05 DIAGNOSIS — E1159 Type 2 diabetes mellitus with other circulatory complications: Secondary | ICD-10-CM

## 2018-06-11 ENCOUNTER — Ambulatory Visit (INDEPENDENT_AMBULATORY_CARE_PROVIDER_SITE_OTHER): Payer: BLUE CROSS/BLUE SHIELD | Admitting: Family Medicine

## 2018-06-11 ENCOUNTER — Encounter: Payer: Self-pay | Admitting: Family Medicine

## 2018-06-11 VITALS — BP 152/80 | HR 72 | Temp 98.7°F | Ht 68.0 in | Wt 239.5 lb

## 2018-06-11 DIAGNOSIS — I10 Essential (primary) hypertension: Secondary | ICD-10-CM

## 2018-06-11 DIAGNOSIS — T464X5A Adverse effect of angiotensin-converting-enzyme inhibitors, initial encounter: Secondary | ICD-10-CM

## 2018-06-11 DIAGNOSIS — R058 Other specified cough: Secondary | ICD-10-CM

## 2018-06-11 DIAGNOSIS — Z0000 Encounter for general adult medical examination without abnormal findings: Secondary | ICD-10-CM | POA: Diagnosis not present

## 2018-06-11 DIAGNOSIS — E1159 Type 2 diabetes mellitus with other circulatory complications: Secondary | ICD-10-CM | POA: Diagnosis not present

## 2018-06-11 DIAGNOSIS — I152 Hypertension secondary to endocrine disorders: Secondary | ICD-10-CM

## 2018-06-11 DIAGNOSIS — R05 Cough: Secondary | ICD-10-CM | POA: Diagnosis not present

## 2018-06-11 MED ORDER — BENZONATATE 100 MG PO CAPS: 100.0000 mg | ORAL_CAPSULE | Freq: Three times a day (TID) | ORAL | 0 refills | Status: DC | PRN

## 2018-06-11 MED ORDER — LOSARTAN POTASSIUM 100 MG PO TABS: 100.0000 mg | ORAL_TABLET | Freq: Every day | ORAL | 3 refills | Status: DC

## 2018-06-11 NOTE — Patient Instructions (Addendum)
Keep up the good work.  Stay active and keep the diet clean.  It can take 6-8 weeks for the cough from the lisinopril to resolve.   Get the flu shot in October.  Call Center for [place] at [facility] at [phone] for an appointment.  They are located at [address], [address], [place], [place], [address] (right across the hall from our office).  Let us know if you need anything.

## 2018-06-11 NOTE — Progress Notes (Signed)
Chief Complaint  Patient presents with  . Cough  . Weight Gain     Well Woman Rhonda Le is here for a complete physical.   Her last physical was >1 year ago.  Current diet: in general, a "great" diet. Current exercise: cardio, lifting weights. Weight is stable and she denies daytime fatigue. No LMP recorded.  Seatbelt? Yes   Pt having cough related to ACEi. Stopped 6 d ago. Has not been checking BP since then.   Health Maintenance Pap/HPV- Yes Mammogram- Yes Tetanus- Yes HIV screening- Yes 04/14/2002  Past Medical History:  Diagnosis Date  . Diabetes mellitus without complication ([place])   . Hypertension   . Stroke [facility])      Past Surgical History:  Procedure Laterality Date  . BREAST REDUCTION SURGERY  2004  . TUBAL LIGATION  2003    Medications  Current Outpatient Medications on File Prior to Visit  Medication Sig Dispense Refill  . aspirin 81 MG chewable tablet Chew 1 tablet (81 mg total) by mouth daily.    Marland Kitchen atorvastatin (LIPITOR) 40 MG tablet Take 1 tablet (40 mg total) by mouth daily. 30 tablet 11  . metFORMIN (GLUCOPHAGE) 1000 MG tablet Take 500 mg by mouth 2 (two) times daily with a meal.    . lisinopril (PRINIVIL,ZESTRIL) 10 MG tablet TAKE 1 TABLET(10 MG) BY MOUTH DAILY (Patient not taking: Reported on 06/11/2018) 30 tablet 0   Allergies No Known Allergies  Review of Systems: Constitutional:  no unexpected weight changes Eye:  no recent significant change in vision Ear/Nose/Mouth/Throat:  Ears:  no tinnitus or vertigo and no recent change in hearing Nose/Mouth/Throat:  no complaints of nasal congestion, no sore throat Cardiovascular: no chest pain Respiratory:  no cough and no shortness of breath Gastrointestinal:  no abdominal pain, no change in bowel habits GU:  Female: negative for dysuria or pelvic pain Musculoskeletal/Extremities:  no pain of the joints Integumentary (Skin/Breast):  no abnormal skin lesions reported Neurologic:  no  headaches Endocrine:  denies fatigue Hematologic/Lymphatic:  No areas of easy bleeding  Exam BP (!) 152/80 (BP Location: Left Arm, Patient Position: Sitting, Cuff Size: Large)   Pulse 72   Temp 98.7 F (37.1 C) (Oral)   Ht 5\' 8"  (1.727 m)   Wt 239 lb 8 oz (108.6 kg)   SpO2 97%   BMI 36.42 kg/m  General:  well developed, well nourished, in no apparent distress Skin:  no significant moles, warts, or growths Head:  no masses, lesions, or tenderness Eyes:  pupils equal and round, sclera anicteric without injection Ears:  canals without lesions, TMs shiny without retraction, no obvious effusion, no erythema Nose:  nares patent, septum midline, mucosa normal, and no drainage or sinus tenderness Throat/Pharynx:  lips and gingiva without lesion; tongue and uvula midline; non-inflamed pharynx; no exudates or postnasal drainage Neck: neck supple without adenopathy, thyromegaly, or masses Lungs:  clear to auscultation, breath sounds equal bilaterally, no respiratory distress Cardio:  regular rate and rhythm, no bruits, no LE edema Abdomen:  abdomen soft, nontender; bowel sounds normal; no masses or organomegaly Genital: Defer to GYN Musculoskeletal:  symmetrical muscle groups noted without atrophy or deformity Extremities:  no clubbing, cyanosis, or edema, no deformities, no skin discoloration Neuro:  gait normal; deep tendon reflexes normal and symmetric Psych: well oriented with normal range of affect and appropriate judgment/insight  Assessment and Plan  Well adult exam - Plan: Comprehensive metabolic panel, Lipid panel  Hypertension associated with diabetes ([place]) -  Plan: CBC, Hemoglobin A1c  ACE-inhibitor cough   [name]. Counseled on diet and exercise. Info for GYN across hall given. Other orders as above. Discussed that the cough medicine has not historically been helpful with ACEi related cough. Follow up in 6 weeks. The patient voiced understanding and agreement  to the plan.  [place], DO 06/11/18 2:19 PM

## 2018-06-11 NOTE — Progress Notes (Signed)
Pre visit review using our clinic review tool, if applicable. No additional management support is needed unless otherwise documented below in the visit note. 

## 2018-06-12 LAB — COMPREHENSIVE METABOLIC PANEL
ALBUMIN: 4.4 g/dL (ref 3.5–5.2)
ALK PHOS: 52 U/L (ref 39–117)
ALT: 21 U/L (ref 0–35)
AST: 17 U/L (ref 0–37)
BILIRUBIN TOTAL: 0.6 mg/dL (ref 0.2–1.2)
BUN: 9 mg/dL (ref 6–23)
CALCIUM: 10.1 mg/dL (ref 8.4–10.5)
CO2: 26 mEq/L (ref 19–32)
CREATININE: 0.92 mg/dL (ref 0.40–1.20)
Chloride: 104 mEq/L (ref 96–112)
GFR: 85.08 mL/min (ref >60.00)
Glucose, Bld: 95 mg/dL (ref 70–99)
Potassium: 4.7 mEq/L (ref 3.5–5.1)
Sodium: 139 mEq/L (ref 135–145)
TOTAL PROTEIN: 7.2 g/dL (ref 6.0–8.3)

## 2018-06-12 LAB — CBC
HCT: 36.3 % (ref 36.0–46.0)
Hemoglobin: 11.7 g/dL — ABNORMAL LOW (ref 12.0–15.0)
MCHC: 32.3 g/dL (ref 30.0–36.0)
MCV: 86.1 fl (ref 78.0–100.0)
PLATELETS: 332 10*3/uL (ref 150.0–400.0)
RBC: 4.22 Mil/uL (ref 3.87–5.11)
RDW: 14.6 % (ref 11.5–15.5)
WBC: 7.4 10*3/uL (ref 4.0–10.5)

## 2018-06-12 LAB — LIPID PANEL
Cholesterol: 125 mg/dL (ref 0–200)
HDL: 41.4 mg/dL (ref >39.00)
LDL CALC: 46 mg/dL (ref 0–99)
NONHDL: 83.6
Total CHOL/HDL Ratio: 3
Triglycerides: 187 mg/dL — ABNORMAL HIGH (ref 0.0–149.0)
VLDL: 37.4 mg/dL (ref 0.0–40.0)

## 2018-06-12 LAB — HEMOGLOBIN A1C: HEMOGLOBIN A1C: 6.6 % — AB (ref 4.6–6.5)

## 2018-06-26 ENCOUNTER — Ambulatory Visit: Payer: BLUE CROSS/BLUE SHIELD | Admitting: Adult Health

## 2018-07-02 ENCOUNTER — Other Ambulatory Visit: Payer: Self-pay | Admitting: Family Medicine

## 2018-07-02 DIAGNOSIS — E1159 Type 2 diabetes mellitus with other circulatory complications: Secondary | ICD-10-CM

## 2018-07-02 DIAGNOSIS — I152 Hypertension secondary to endocrine disorders: Secondary | ICD-10-CM

## 2018-07-02 DIAGNOSIS — I10 Essential (primary) hypertension: Principal | ICD-10-CM

## 2018-08-04 ENCOUNTER — Encounter: Payer: Self-pay | Admitting: Family Medicine

## 2018-08-15 ENCOUNTER — Encounter: Payer: Self-pay | Admitting: Family Medicine

## 2018-08-15 ENCOUNTER — Ambulatory Visit (INDEPENDENT_AMBULATORY_CARE_PROVIDER_SITE_OTHER): Payer: BLUE CROSS/BLUE SHIELD | Admitting: Family Medicine

## 2018-08-15 MED ORDER — PHENTERMINE HCL 30 MG PO CAPS: 30.0000 mg | ORAL_CAPSULE | ORAL | 0 refills | Status: DC

## 2018-08-15 NOTE — Progress Notes (Signed)
Chief Complaint  Patient presents with  . Follow-up    weight management    Subjective: Patient is a 42 y.o. female here for wt loss management.  Hx of obesity, having wt gain. Had Adipex in past that helped her get going. Did have nml TSH earlier in year. Reports eating healthy and exercising routinely with both cardio and strength training.    ROS: Endo: +wt gain  Past Medical History:  Diagnosis Date  . Diabetes mellitus without complication ([place])   . Hypertension   . Stroke ([place])     Objective: BP 132/82 (BP Location: Left Arm, Patient Position: Sitting, Cuff Size: Large)   Pulse 81   Temp 97.7 F (36.5 C) (Oral)   Ht 5\' 8"  (1.727 m)   Wt 241 lb 2 oz (109.4 kg)   SpO2 98%   BMI 36.66 kg/m  General: Awake, appears stated age Neuro: DTR's equal and symmetric throughout, no clonus Heart: RRR, no murmurs Lungs: CTAB, no rales, wheezes or rhonchi. No accessory muscle use Psych: Age appropriate judgment and insight, normal affect and mood  Assessment and Plan: Morbid obesity ([place]) - Plan: phentermine 30 MG capsule  Orders as above. Start w 1 mo. Ck BP at home.  Counseled on diet and exercise. F/u in 1 mo.  The patient voiced understanding and agreement to the plan.  [place], DO 08/15/18  8:58 AM

## 2018-08-15 NOTE — Patient Instructions (Signed)
Check blood pressure 2-3 times per week. Please let me know if you start having increasing blood pressure or pulse.  Keep the diet clean and stay active.  Let us know if you need anything.

## 2018-08-15 NOTE — Progress Notes (Signed)
Pre visit review using our clinic review tool, if applicable. No additional management support is needed unless otherwise documented below in the visit note. 

## 2018-09-15 ENCOUNTER — Encounter: Payer: Self-pay | Admitting: Family Medicine

## 2018-09-19 ENCOUNTER — Other Ambulatory Visit: Payer: Self-pay | Admitting: Family Medicine

## 2018-09-19 ENCOUNTER — Encounter: Payer: Self-pay | Admitting: Family Medicine

## 2018-09-19 MED ORDER — METFORMIN HCL 1000 MG PO TABS: 500.0000 mg | ORAL_TABLET | Freq: Two times a day (BID) | ORAL | 1 refills | Status: DC

## 2018-09-19 NOTE — Telephone Encounter (Signed)
Copied from [place] [id number]. Topic: Quick Communication - Rx Refill/Question >> Sep 19, 2018  1:24 PM Oneta Rack wrote:  Medication: metFORMIN (GLUCOPHAGE) 1000 MG tablet  Has the patient contacted their pharmacy? yes   (Agent: If yes, when and what did the pharmacy advise?) patient insurance information has changed, demographics have been updated and pharmacy is aware of the changed and advised patient to contact PCP office.   Preferred Pharmacy (with phone number or street name):  [id number] - [place], [place] - [address] [phone] (Phone) [phone] (Fax)  Agent: Please be advised that RX refills may take up to 3 business days. We ask that you follow-up with your pharmacy. Patient informed but stated she will run out on Monday

## 2018-10-17 ENCOUNTER — Encounter: Payer: Self-pay | Admitting: Family Medicine

## 2018-10-17 MED ORDER — ATORVASTATIN CALCIUM 40 MG PO TABS: 40.0000 mg | ORAL_TABLET | Freq: Every day | ORAL | 2 refills | Status: DC

## 2019-06-19 ENCOUNTER — Other Ambulatory Visit: Payer: Self-pay | Admitting: Family Medicine

## 2019-06-28 ENCOUNTER — Other Ambulatory Visit: Payer: Self-pay | Admitting: Family Medicine

## 2019-07-13 ENCOUNTER — Ambulatory Visit (INDEPENDENT_AMBULATORY_CARE_PROVIDER_SITE_OTHER): Payer: BC Managed Care – PPO | Admitting: Family Medicine

## 2019-07-13 ENCOUNTER — Encounter: Payer: Self-pay | Admitting: Family Medicine

## 2019-07-13 ENCOUNTER — Other Ambulatory Visit: Payer: Self-pay

## 2019-07-13 VITALS — BP 124/82 | HR 81 | Temp 97.6°F | Ht 68.0 in | Wt 244.1 lb

## 2019-07-13 DIAGNOSIS — E119 Type 2 diabetes mellitus without complications: Secondary | ICD-10-CM

## 2019-07-13 DIAGNOSIS — Z1239 Encounter for other screening for malignant neoplasm of breast: Secondary | ICD-10-CM

## 2019-07-13 DIAGNOSIS — Z0000 Encounter for general adult medical examination without abnormal findings: Secondary | ICD-10-CM

## 2019-07-13 DIAGNOSIS — Z23 Encounter for immunization: Secondary | ICD-10-CM

## 2019-07-13 LAB — LIPID PANEL
Cholesterol: 128 mg/dL (ref 0–200)
HDL: 42.7 mg/dL (ref >39.00)
LDL Cholesterol: 54 mg/dL (ref 0–99)
NonHDL: 85.35
Total CHOL/HDL Ratio: 3
Triglycerides: 157 mg/dL — ABNORMAL HIGH (ref 0.0–149.0)
VLDL: 31.4 mg/dL (ref 0.0–40.0)

## 2019-07-13 LAB — COMPREHENSIVE METABOLIC PANEL
ALT: 34 U/L (ref 0–35)
AST: 26 U/L (ref 0–37)
Albumin: 4.4 g/dL (ref 3.5–5.2)
Alkaline Phosphatase: 48 U/L (ref 39–117)
BUN: 8 mg/dL (ref 6–23)
CO2: 26 mEq/L (ref 19–32)
Calcium: 10.2 mg/dL (ref 8.4–10.5)
Chloride: 105 mEq/L (ref 96–112)
Creatinine, Ser: 0.91 mg/dL (ref 0.40–1.20)
GFR: 80.67 mL/min (ref >60.00)
Glucose, Bld: 73 mg/dL (ref 70–99)
Potassium: 4.1 mEq/L (ref 3.5–5.1)
Sodium: 139 mEq/L (ref 135–145)
Total Bilirubin: 0.4 mg/dL (ref 0.2–1.2)
Total Protein: 7.1 g/dL (ref 6.0–8.3)

## 2019-07-13 LAB — CBC
HCT: 32.9 % — ABNORMAL LOW (ref 36.0–46.0)
Hemoglobin: 10.6 g/dL — ABNORMAL LOW (ref 12.0–15.0)
MCHC: 32.2 g/dL (ref 30.0–36.0)
MCV: 83.8 fl (ref 78.0–100.0)
Platelets: 282 10*3/uL (ref 150.0–400.0)
RBC: 3.92 Mil/uL (ref 3.87–5.11)
RDW: 15.9 % — ABNORMAL HIGH (ref 11.5–15.5)
WBC: 6.3 10*3/uL (ref 4.0–10.5)

## 2019-07-13 LAB — MICROALBUMIN / CREATININE URINE RATIO
Creatinine,U: 58.9 mg/dL
Microalb Creat Ratio: 1.2 mg/g (ref 0.0–30.0)
Microalb, Ur: <0.7 mg/dL (ref 0.0–1.9)

## 2019-07-13 LAB — HEMOGLOBIN A1C: Hgb A1c MFr Bld: 6.9 % — ABNORMAL HIGH (ref 4.6–6.5)

## 2019-07-13 NOTE — Patient Instructions (Addendum)
Give [place] 2-3 business days to get the results of your labs back.   Keep the diet clean and stay active.   If you do not hear anything about your referral in the next 1-2 weeks, call our office and ask for an update.  Take 1000 units of Vit D daily. Consider a multi-vitamin.   Call your insurance and see if "irritated skin tag removal" is covered under you plan. If so, we can remove them here.   OK to use Debrox (peroxide) in the ear to loosen up wax. Also recommend using a bulb syringe (for removing boogers from baby's noses) to flush through warm water. Do not use Q-tips as this can impact wax further.   Let us know if you need anything.

## 2019-07-13 NOTE — Addendum Note (Signed)
Addended by: Sharon Seller B on: 07/13/2019 01:31 PM   Modules accepted: Orders

## 2019-07-13 NOTE — Progress Notes (Signed)
Chief Complaint  Patient presents with  . Annual Exam     Well Woman Rhonda Le is here for a complete physical.   Her last physical was >1 year ago.  Current diet: in general, a "healthy" diet. Current exercise: walking, band exercises. Weight has been increasing and she denies daytime fatigue. No LMP recorded.  Seatbelt? Yes  Health Maintenance Pap/HPV- Yes Mammogram- No Tetanus- Yes HIV screening- Yes  Past Medical History:  Diagnosis Date  . Diabetes mellitus without complication ([place])   . Hypertension   . Stroke [facility])      Past Surgical History:  Procedure Laterality Date  . BREAST REDUCTION SURGERY  2004  . TUBAL LIGATION  2003    Medications  Current Outpatient Medications on File Prior to Visit  Medication Sig Dispense Refill  . aspirin 81 MG chewable tablet Chew 1 tablet (81 mg total) by mouth daily.    Marland Kitchen atorvastatin (LIPITOR) 40 MG tablet TAKE 1 TABLET BY MOUTH EVERY DAY 90 tablet 0  . losartan (COZAAR) 100 MG tablet TAKE 1 TABLET BY MOUTH EVERY DAY 90 tablet 2  . metFORMIN (GLUCOPHAGE) 1000 MG tablet TAKE 0.5 TABLETS (500 MG TOTAL) BY MOUTH 2 (TWO) TIMES DAILY WITH A MEAL. 180 tablet 1   Allergies No Known Allergies  Review of Systems: Constitutional:  no unexpected weight changes Eye:  no recent significant change in vision Ear/Nose/Mouth/Throat:  Ears:  no tinnitus or vertigo and no recent change in hearing Nose/Mouth/Throat:  no complaints of nasal congestion, no sore throat Cardiovascular: no chest pain Respiratory:  no cough and no shortness of breath Gastrointestinal:  no abdominal pain, no change in bowel habits GU:  Female: negative for dysuria or pelvic pain Musculoskeletal/Extremities:  no pain of the joints Integumentary (Skin/Breast): +skin tags; otherwise no abnormal skin lesions reported Neurologic:  no headaches Endocrine:  denies fatigue Hematologic/Lymphatic:  No areas of easy bleeding  Exam BP 124/82 (BP Location: Left  Arm, Patient Position: Sitting, Cuff Size: Large)   Pulse 81   Temp 97.6 F (36.4 C) (Temporal)   Ht 5\' 8"  (1.727 m)   Wt 244 lb 2 oz (110.7 kg)   SpO2 96%   BMI 37.12 kg/m  General:  well developed, well nourished, in no apparent distress Skin: +large skin tag on L side of neck; otherwise no significant moles, warts, or growths Head:  no masses, lesions, or tenderness Eyes:  pupils equal and round, sclera anicteric without injection Ears:  canals without lesions, TMs shiny without retraction, no obvious effusion, no erythema Nose:  nares patent, septum midline, mucosa normal, and no drainage or sinus tenderness Throat/Pharynx:  lips and gingiva without lesion; tongue and uvula midline; non-inflamed pharynx; no exudates or postnasal drainage Neck: neck supple without adenopathy, thyromegaly, or masses Lungs:  clear to auscultation, breath sounds equal bilaterally, no respiratory distress Cardio:  regular rate and rhythm, no bruits, no LE edema Abdomen:  abdomen soft, nontender; bowel sounds normal; no masses or organomegaly Genital: Defer to GYN Musculoskeletal:  symmetrical muscle groups noted without atrophy or deformity Extremities:  no clubbing, cyanosis, or edema, no deformities, no skin discoloration Neuro:  gait normal; deep tendon reflexes normal and symmetric Psych: well oriented with normal range of affect and appropriate judgment/insight  Assessment and Plan  Well adult exam - Plan: CBC, Comprehensive metabolic panel, Lipid panel  Type 2 diabetes mellitus without complication, without long-term current use of insulin (HCC) - Plan: Microalbumin / creatinine urine ratio, Hemoglobin A1c, HM  DIABETES FOOT EXAM  Morbid obesity ([place]) - Plan: Amb Ref to Medical Weight Management  Screening for breast cancer - Plan: MM DIGITAL SCREENING BILATERAL   Well 43 y.o. female. Counseled on diet and exercise. Other orders as above. Follow up 6 mo or prn. The patient voiced  understanding and agreement to the plan.  [place], DO 07/13/19 1:26 PM

## 2019-07-14 ENCOUNTER — Other Ambulatory Visit (INDEPENDENT_AMBULATORY_CARE_PROVIDER_SITE_OTHER): Payer: BC Managed Care – PPO

## 2019-07-14 DIAGNOSIS — D649 Anemia, unspecified: Secondary | ICD-10-CM | POA: Diagnosis not present

## 2019-07-14 LAB — IBC + FERRITIN
Ferritin: 7.7 ng/mL — ABNORMAL LOW (ref 10.0–291.0)
Iron: 31 ug/dL — ABNORMAL LOW (ref 42–145)
Saturation Ratios: 7 % — ABNORMAL LOW (ref 20.0–50.0)
Transferrin: 316 mg/dL (ref 212.0–360.0)

## 2019-07-27 ENCOUNTER — Ambulatory Visit (HOSPITAL_BASED_OUTPATIENT_CLINIC_OR_DEPARTMENT_OTHER)
Admission: RE | Admit: 2019-07-27 | Discharge: 2019-07-27 | Disposition: A | Discharge status: Home / Self Care | Payer: BC Managed Care – PPO | Source: Ambulatory Visit | Attending: Family Medicine | Admitting: Family Medicine

## 2019-07-27 ENCOUNTER — Encounter (HOSPITAL_BASED_OUTPATIENT_CLINIC_OR_DEPARTMENT_OTHER): Payer: Self-pay

## 2019-07-27 ENCOUNTER — Other Ambulatory Visit: Payer: Self-pay

## 2019-07-27 DIAGNOSIS — Z1239 Encounter for other screening for malignant neoplasm of breast: Secondary | ICD-10-CM

## 2019-07-27 DIAGNOSIS — Z1231 Encounter for screening mammogram for malignant neoplasm of breast: Secondary | ICD-10-CM | POA: Diagnosis present

## 2019-08-24 ENCOUNTER — Encounter: Payer: Self-pay | Admitting: Family Medicine

## 2019-09-03 ENCOUNTER — Encounter: Payer: Self-pay | Admitting: Family Medicine

## 2019-09-04 ENCOUNTER — Other Ambulatory Visit: Payer: Self-pay

## 2019-09-04 ENCOUNTER — Encounter: Payer: Self-pay | Admitting: Family Medicine

## 2019-09-04 ENCOUNTER — Ambulatory Visit (INDEPENDENT_AMBULATORY_CARE_PROVIDER_SITE_OTHER): Payer: BC Managed Care – PPO | Admitting: Family Medicine

## 2019-09-04 DIAGNOSIS — Z01810 Encounter for preprocedural cardiovascular examination: Secondary | ICD-10-CM | POA: Diagnosis not present

## 2019-09-04 NOTE — Progress Notes (Signed)
Subjective:   Chief Complaint  Patient presents with  . paperwork for Dental procedure    Rhonda Le  is here for a Pre-operative physical for wisdom tooth removal. Due to COVID-19 pandemic, we are interacting via web portal for an electronic face-to-face visit. I verified patient's ID using 2 identifiers. Patient agreed to proceed with visit via this method. Patient is at home, I am at office. Patient and I are present for visit.   Personal or family hx of adverse outcome to anesthesia? No  Chipped, cracked, missing, or loose teeth? No  Decreased ROM of neck? no Able to walk up 2 flights of stairs without becoming significantly short of breath or having chest pain? Yes   Revised Goldman Criteria: High Risk Surgery (intraperitoneal, intrathoracic, aortic): No  Ischemic heart disease (Prior MI, +excercise stress test, angina, nitrate use, Qwave): No  History of heart failure: No  History of cerebrovascular disease: No  History of diabetes: Yes  Insulin therapy for DM: No  Preoperative Cr >2.0: No   Patient Active Problem List   Diagnosis Date Noted  . Morbid obesity ([place]) 08/15/2018  . Hypertension associated with diabetes ([place]) 12/12/2017  . TIA (transient ischemic attack) 10/21/2017  . Anemia 10/21/2017  . Diabetes ([place]) 10/21/2017   Past Medical History:  Diagnosis Date  . Diabetes mellitus without complication ([place])   . Hypertension   . Stroke [facility])     Past Surgical History:  Procedure Laterality Date  . BREAST REDUCTION SURGERY  2004  . REDUCTION MAMMAPLASTY    . TUBAL LIGATION  2003    Current Outpatient Medications  Medication Sig Dispense Refill  . aspirin 81 MG chewable tablet Chew 1 tablet (81 mg total) by mouth daily.    Marland Kitchen atorvastatin (LIPITOR) 40 MG tablet TAKE 1 TABLET BY MOUTH EVERY DAY 90 tablet 0  . losartan (COZAAR) 100 MG tablet TAKE 1 TABLET BY MOUTH EVERY DAY 90 tablet 2  . metFORMIN (GLUCOPHAGE) 1000 MG tablet TAKE 0.5 TABLETS (500 MG TOTAL) BY  MOUTH 2 (TWO) TIMES DAILY WITH A MEAL. 180 tablet 1   No Known Allergies  Family History  Problem Relation Age of Onset  . Stroke Mother   . Heart disease Mother   . Depression Mother   . Stroke Maternal Aunt   . Depression Father   . High Cholesterol Father   . High blood pressure Father      Review of Systems:  Constitutional:  no fevers Eye:  no recent significant change in vision Ear:  no hearing loss Nose/Mouth/Throat:  No dental complaints Neck/Thyroid:  no lumps or masses Pulmonary:  No shortness of breath Cardiovascular:  no chest pain Gastrointestinal:  no abdominal pain GU:  negative for dysuria Musculoskeletal/Extremities:  no pain Skin/Integumentary ROS:  no abnormal skin lesions reported Neurologic:  no HA   Objective:  No conversational dyspnea Age appropriate judgment and insight Nml affect and mood   Assessment:   Preop cardiovascular exam   Plan:   No labs or EKG requested.  I did not think this is necessary for IV sedation for wisdom tooth removal.  She should stop her aspirin 5 days prior to the procedure.  Restarting will be based off of her surgeon's recommendations.  She is otherwise optimized medically for the proposed procedure. Follow-up as originally scheduled. The patient voiced understanding and agreement to the plan.  [place], DO 09/04/19  4:04 PM

## 2019-10-01 ENCOUNTER — Other Ambulatory Visit: Payer: Self-pay | Admitting: Family Medicine

## 2019-10-01 ENCOUNTER — Telehealth: Payer: Self-pay

## 2019-10-01 NOTE — Telephone Encounter (Signed)
Last OV 09/04/19 Last refill 06/29/19 #90/0 Next OV 01/11/20

## 2019-10-01 NOTE — Telephone Encounter (Signed)
Katie called to check the status of surgical clearance  If Joellen Jersey is not there please ask for [facility] .  Call back [phone] Fax  [phone]

## 2019-10-01 NOTE — Telephone Encounter (Signed)
Copied from [place] [id number]. Topic: General - Inquiry >> Sep 30, 2019 11:56 AM Berneta Levins wrote: Reason for CRM:   Katie with [facility] calling to check on the status of a surgical clearance form they have faxed over five times but have not received back.

## 2019-10-02 NOTE — Telephone Encounter (Signed)
Called left detailed message we have faxed this with OV notes on 08/25/2019, 08/31/2019 and 09/08/2019 and 09/16/2019. To please let us know if they have not received and will fax again today 10/02/2019 Have received fax confirmation.

## 2019-10-05 ENCOUNTER — Telehealth: Payer: Self-pay | Admitting: Family Medicine

## 2019-10-05 NOTE — Telephone Encounter (Signed)
Copied from [place] [phone]. Topic: General - Inquiry >> Oct 05, 2019 12:23 PM Alease Frame wrote: Reason for CRM: Katie from [facility] . Med clearance was sent over on [id number] however Dr Serita Sheller signature was not on form . Unable to accept please send med clearance over again with signature.   Fax number [phone]

## 2019-10-05 NOTE — Telephone Encounter (Signed)
They are faxing over the form

## 2019-10-05 NOTE — Telephone Encounter (Signed)
They have sent back for PCP Signature/aspirin instructions. PCP signed all information needed. Faxed back to them.

## 2019-10-05 NOTE — Telephone Encounter (Signed)
Form has been faxed and sent to scan Called the dental office to have sent back to our office/PCP can sign then will fax.

## 2019-11-19 ENCOUNTER — Other Ambulatory Visit: Payer: Self-pay

## 2019-11-19 ENCOUNTER — Encounter (INDEPENDENT_AMBULATORY_CARE_PROVIDER_SITE_OTHER): Payer: Self-pay | Admitting: Family Medicine

## 2019-11-19 ENCOUNTER — Ambulatory Visit (INDEPENDENT_AMBULATORY_CARE_PROVIDER_SITE_OTHER): Payer: BC Managed Care – PPO | Admitting: Family Medicine

## 2019-11-19 VITALS — BP 120/67 | HR 72 | Temp 98.5°F | Ht 67.0 in | Wt 245.0 lb

## 2019-11-19 DIAGNOSIS — E1169 Type 2 diabetes mellitus with other specified complication: Secondary | ICD-10-CM

## 2019-11-19 DIAGNOSIS — R0602 Shortness of breath: Secondary | ICD-10-CM | POA: Diagnosis not present

## 2019-11-19 DIAGNOSIS — R5383 Other fatigue: Secondary | ICD-10-CM | POA: Diagnosis not present

## 2019-11-19 DIAGNOSIS — F3289 Other specified depressive episodes: Secondary | ICD-10-CM | POA: Diagnosis not present

## 2019-11-19 DIAGNOSIS — D508 Other iron deficiency anemias: Secondary | ICD-10-CM

## 2019-11-19 DIAGNOSIS — I10 Essential (primary) hypertension: Secondary | ICD-10-CM

## 2019-11-19 DIAGNOSIS — E785 Hyperlipidemia, unspecified: Secondary | ICD-10-CM

## 2019-11-19 DIAGNOSIS — E1159 Type 2 diabetes mellitus with other circulatory complications: Secondary | ICD-10-CM | POA: Diagnosis not present

## 2019-11-19 DIAGNOSIS — I152 Hypertension secondary to endocrine disorders: Secondary | ICD-10-CM

## 2019-11-19 DIAGNOSIS — Z9189 Other specified personal risk factors, not elsewhere classified: Secondary | ICD-10-CM | POA: Diagnosis not present

## 2019-11-19 DIAGNOSIS — Z0289 Encounter for other administrative examinations: Secondary | ICD-10-CM

## 2019-11-19 DIAGNOSIS — Z6838 Body mass index (BMI) 38.0-38.9, adult: Secondary | ICD-10-CM

## 2019-11-19 NOTE — Progress Notes (Signed)
Dear Dr. Nani Ravens,   Thank you for referring Rhonda Le to our clinic. The following note includes my evaluation and treatment recommendations.  Chief Complaint:   OBESITY Rhonda Le (MR# [id number]) is a 44 y.o. female who presents for evaluation and treatment of obesity and related comorbidities. Current BMI is Body mass index is 38.37 kg/m. Rhonda Le has been struggling with her weight for many years and has been unsuccessful in either losing weight, maintaining weight loss, or reaching her healthy weight goal.  Rhonda Le is currently in the action stage of change and ready to dedicate time achieving and maintaining a healthier weight. Rhonda Le is interested in becoming our patient and working on intensive lifestyle modifications including (but not limited to) diet and exercise for weight loss.  Rhonda Le's goal is to control her diabetes and heart disease risk factors.  Her goal weight is 185 pounds.  She prefers a Mediterranean diet.  Rhonda Le's habits were reviewed today and are as follows: Her family occasionally eats meals together, she thinks her family will eat healthier with her, her desired weight loss is 85 pounds, she started gaining weight after her mother died, her heaviest weight ever was 248 pounds, she craves salty chips, she snacks frequently in the evenings, she skips breakfast frequently, she is frequently drinking liquids with calories and she struggles with emotional eating.  Depression Screen Rhonda Le's Food and Mood (modified PHQ-9) score was 9.  Depression screen PHQ 2/9 11/19/2019  Decreased Interest 2  Down, Depressed, Hopeless 1  PHQ - 2 Score 3  Altered sleeping 2  Tired, decreased energy 1  Change in appetite 2  Feeling bad or failure about yourself  1  Trouble concentrating 0  Moving slowly or fidgety/restless 0  Suicidal thoughts 0  PHQ-9 Score 9  Difficult doing work/chores Not difficult at all    Subjective:   1. Other fatigue Rhonda Le denies daytime somnolence and admits to waking up still tired. Patent has a history of symptoms of morning fatigue and snoring. Rhonda Le generally gets 6-8 hours of sleep per night. Snoring is present. Apneic episodes are not present. Epworth Sleepiness Score is 5.  2. Shortness of breath on exertion Eliz notes increasing shortness of breath with exercising. Rhonda Le denies shortness of breath at rest or orthopnea.  3. Hyperlipidemia associated with type 2 diabetes mellitus ([place]) Rhonda Le has hyperlipidemia and has been trying to improve her cholesterol levels with intensive lifestyle modification including a low saturated fat diet, exercise and weight loss. She denies any chest pain, claudication or myalgias.  She is currently taking Zocor.  Lab Results  Component Value Date   ALT 34 07/13/2019   AST 26 07/13/2019   ALKPHOS 48 07/13/2019   BILITOT 0.4 07/13/2019   Lab Results  Component Value Date   CHOL 128 07/13/2019   HDL 42.70 07/13/2019   LDLCALC 54 07/13/2019   TRIG 157.0 (H) 07/13/2019   CHOLHDL 3 07/13/2019   4. Hypertension associated with diabetes ([place]) Review: taking medications as instructed, no medication side effects noted, no chest pain on exertion, no dyspnea on exertion, no swelling of ankles.  She is taking losartan.  Blood pressure is currently at goal.  BP Readings from Last 3 Encounters:  11/19/19 120/67  07/13/19 124/82  08/15/18 132/82   5. Type 2 diabetes mellitus with other specified complication, without long-term current use of insulin (HCC) Medications reviewed. Rhonda Le is currently taking metformin 500 mg twice daily.  Lab Results  Component Value Date  HGBA1C 6.9 (H) 07/13/2019   HGBA1C 6.6 (H) 06/11/2018   HGBA1C 6.1 (H) 10/21/2017   Lab Results  Component Value Date   MICROALBUR <0.7 07/13/2019   LDLCALC 54 07/13/2019   CREATININE 0.91 07/13/2019   6. Other iron  deficiency anemia Rhonda Le is not a vegetarian.  She does not have a history of weight loss surgery.   CBC Latest Ref Rng & Units 07/13/2019 06/11/2018 10/22/2017  WBC 4.0 - 10.5 K/uL 6.3 7.4 6.7  Hemoglobin 12.0 - 15.0 g/dL 10.6(L) 11.7(L) 10.5(L)  Hematocrit 36.0 - 46.0 % 32.9(L) 36.3 32.0(L)  Platelets 150.0 - 400.0 K/uL 282.0 332.0 261   Lab Results  Component Value Date   IRON 31 (L) 07/14/2019   TIBC 413 10/21/2017   FERRITIN 7.7 (L) 07/14/2019   Lab Results  Component Value Date   VITAMINB12 362 10/21/2017   7. Other depression, with emotional eating Rhonda Le is struggling with emotional eating and using food for comfort to the extent that it is negatively impacting her health. She has been working on behavior modification techniques to help reduce her emotional eating and has been unsuccessful. She shows no sign of suicidal or homicidal ideations.  PHQ-9 is 9 today.  8. At risk for constipation Rhonda Le is at increased risk for constipation due to inadequate water intake, changes in diet, and/or use of medications such as GLP1 agonists. Rhonda Le denies hard, infrequent stools currently.   Assessment/Plan:   1. Other fatigue Rhonda Le does not feel that her weight is causing her energy to be lower than it should be. Fatigue may be related to obesity, depression or many other causes. Labs will be ordered, and in the meanwhile, Rhonda Le will focus on self care including making healthy food choices, increasing physical activity and focusing on stress reduction.  Orders - EKG 12-Lead - VITAMIN D 25 Hydroxy (Vit-D Deficiency, Fractures) - TSH - T3 - T4, free  2. Shortness of breath on exertion Rhonda Le does not feel that she gets out of breath more easily that she used to when she exercises. Rhonda Le's shortness of breath appears to be obesity related and exercise induced. She has agreed to work on weight loss and gradually increase exercise to treat  her exercise induced shortness of breath. Will continue to monitor closely.  Indirect calorimetry today.  3. Hyperlipidemia associated with type 2 diabetes mellitus ([place]) Cardiovascular risk and specific lipid/LDL goals reviewed.  We discussed several lifestyle modifications today and Rhonda Le will continue to work on diet, exercise and weight loss efforts. Orders and follow up as documented in patient record.   Counseling Intensive lifestyle modifications are the first line treatment for this issue. . Dietary changes: Increase soluble fiber. Decrease simple carbohydrates. . Exercise changes: Moderate to vigorous-intensity aerobic activity 150 minutes per week if tolerated. . Lipid-lowering medications: see documented in medical record.  Orders - Lipid panel  4. Hypertension associated with diabetes ([place]) Latashia is working on healthy weight loss and exercise to improve blood pressure control. We will watch for signs of hypotension as she continues her lifestyle modifications.  5. Type 2 diabetes mellitus with other specified complication, without long-term current use of insulin (HCC) Good blood sugar control is important to decrease the likelihood of diabetic complications such as nephropathy, neuropathy, limb loss, blindness, coronary artery disease, and death. Intensive lifestyle modification including diet, exercise and weight loss are the first line of treatment for diabetes.  Consider GLP-1 in the future.  Orders - Comprehensive metabolic panel - Hemoglobin A1c  6. Other iron deficiency anemia Orders and follow up as documented in patient record.  Will monitor.  Consider infusion.  Counseling . Iron is essential for our bodies to make red blood cells.  Reasons that someone may be deficient include: an iron-deficient diet (more likely in those following vegan or vegetarian diets), women with heavy menses, patients with GI disorders or poor absorption, patients that have had  bariatric surgery, frequent blood donors, patients with cancer, and patients with heart disease.   [place] Noble foods include dark leafy greens, red and white meats, eggs, seafood, and beans.   . Certain foods and drinks prevent your body from absorbing iron properly. Avoid eating these foods in the same meal as iron-rich foods or with iron supplements. These foods include: coffee, black tea, and red wine; milk, dairy products, and foods that are high in calcium; beans and soybeans; whole grains.  . Constipation can be a side effect of iron supplementation. Increased water and fiber intake are helpful. Water goal: > 2 liters/day. Fiber goal: > 25 grams/day. - CBC with Differential/Platelet - Anemia panel  7. Other depression, with emotional eating Behavior modification techniques were discussed today to help Dawsyn deal with her emotional/non-hunger eating behaviors.  Orders and follow up as documented in patient record.   8. At risk for constipation Meribeth was given approximately 15 minutes of counseling today regarding prevention of constipation. She was encouraged to increase water and fiber intake.   9. Class 2 severe obesity with serious comorbidity and body mass index (BMI) of 38.0 to 38.9 in adult, unspecified obesity type ([place]) Reisha is currently in the action stage of change and her goal is to continue with weight loss efforts. I recommend [place] begin the structured treatment plan as follows:  She has agreed to the [employer] plus extra 100 calorie snack.  Exercise goals: For substantial health benefits, adults should do at least 150 minutes (2 hours and 30 minutes) a week of moderate-intensity, or 75 minutes (1 hour and 15 minutes) a week of vigorous-intensity aerobic physical activity, or an equivalent combination of moderate- and vigorous-intensity aerobic activity. Aerobic activity should be performed in episodes of at least 10 minutes, and preferably, it  should be spread throughout the week. Adults should also include muscle-strengthening activities that involve all major muscle groups on 2 or more days a week.   Behavioral modification strategies: increasing lean protein intake, decreasing simple carbohydrates, increasing vegetables and increasing water intake.  She was informed of the importance of frequent follow-up visits to maximize her success with intensive lifestyle modifications for her multiple health conditions. She was informed we would discuss her lab results at her next visit unless there is a critical issue that needs to be addressed sooner. Treina agreed to keep her next visit at the agreed upon time to discuss these results.  Objective:   Blood pressure 120/67, pulse 72, temperature 98.5 F (36.9 C), temperature source Oral, height 5\' 7"  (1.702 m), weight 245 lb (111.1 kg), last menstrual period 10/27/2019, SpO2 98 %. Body mass index is 38.37 kg/m.  EKG: Normal sinus rhythm, rate 84 bpm.  Indirect Calorimeter completed today shows a VO2 of 332 and a REE of 2310.  Her calculated basal metabolic rate is [id number] thus her basal metabolic rate is better than expected.  General: Cooperative, alert, well developed, in no acute distress. HEENT: Conjunctivae and lids unremarkable. Cardiovascular: Regular rhythm.  Lungs: Normal work of breathing. Neurologic: No focal deficits.   Lab Results  Component Value Date   CREATININE 0.91 07/13/2019   BUN 8 07/13/2019   NA 139 07/13/2019   K 4.1 07/13/2019   CL 105 07/13/2019   CO2 26 07/13/2019   Lab Results  Component Value Date   ALT 34 07/13/2019   AST 26 07/13/2019   ALKPHOS 48 07/13/2019   BILITOT 0.4 07/13/2019   Lab Results  Component Value Date   HGBA1C 6.9 (H) 07/13/2019   HGBA1C 6.6 (H) 06/11/2018   HGBA1C 6.1 (H) 10/21/2017   No results found for: INSULIN Lab Results  Component Value Date   TSH 3.140 10/21/2017   Lab Results  Component Value Date   CHOL  128 07/13/2019   HDL 42.70 07/13/2019   LDLCALC 54 07/13/2019   TRIG 157.0 (H) 07/13/2019   CHOLHDL 3 07/13/2019   Lab Results  Component Value Date   WBC 6.3 07/13/2019   HGB 10.6 (L) 07/13/2019   HCT 32.9 (L) 07/13/2019   MCV 83.8 07/13/2019   PLT 282.0 07/13/2019   Lab Results  Component Value Date   IRON 31 (L) 07/14/2019   TIBC 413 10/21/2017   FERRITIN 7.7 (L) 07/14/2019   Attestation Statements:   This is the patient's first visit at [facility]. The patient's NEW PATIENT PACKET was reviewed at length. Included in the packet: current and past health history, medications, allergies, ROS, gynecologic history (women only), surgical history, family history, social history, weight history, weight loss surgery history (for those that have had weight loss surgery), nutritional evaluation, mood and food questionnaire, PHQ9, Epworth questionnaire, sleep habits questionnaire, patient life and health improvement goals questionnaire. These will all be scanned into the patient's chart under media.   During the visit, I independently reviewed the patient's EKG, bioimpedance scale results, and indirect calorimeter results. I used this information to tailor a meal plan for the patient that will help her to lose weight and will improve her obesity-related conditions going forward. I performed a medically necessary appropriate examination and/or evaluation. I discussed the assessment and treatment plan with the patient. The patient was provided an opportunity to ask questions and all were answered. The patient agreed with the plan and demonstrated an understanding of the instructions. Labs were ordered at this visit and will be reviewed at the next visit unless more critical results need to be addressed immediately. Clinical information was updated and documented in the EMR.   Time spent on visit including pre-visit chart review and post-visit care was 60 minutes.   I, Water quality scientist,  CMA, am acting as Location manager for [employer], DO.  I have reviewed the above documentation for accuracy and completeness, and I agree with the above. Briscoe Deutscher, DO

## 2019-11-20 ENCOUNTER — Encounter: Payer: Self-pay | Admitting: Family Medicine

## 2019-11-20 ENCOUNTER — Encounter (INDEPENDENT_AMBULATORY_CARE_PROVIDER_SITE_OTHER): Payer: Self-pay | Admitting: Family Medicine

## 2019-11-20 LAB — CBC WITH DIFFERENTIAL/PLATELET
Basophils Absolute: 0 10*3/uL (ref 0.0–0.2)
Basos: 1 %
EOS (ABSOLUTE): 0.1 10*3/uL (ref 0.0–0.4)
Eos: 2 %
Hemoglobin: 12 g/dL (ref 11.1–15.9)
Immature Grans (Abs): 0 10*3/uL (ref 0.0–0.1)
Immature Granulocytes: 1 %
Lymphocytes Absolute: 2.4 10*3/uL (ref 0.7–3.1)
Lymphs: 37 %
MCH: 28.6 pg (ref 26.6–33.0)
MCHC: 32 g/dL (ref 31.5–35.7)
MCV: 90 fL (ref 79–97)
Monocytes Absolute: 0.5 10*3/uL (ref 0.1–0.9)
Monocytes: 8 %
Neutrophils Absolute: 3.4 10*3/uL (ref 1.4–7.0)
Neutrophils: 51 %
Platelets: 329 10*3/uL (ref 150–450)
RBC: 4.19 x10E6/uL (ref 3.77–5.28)
RDW: 14.1 % (ref 11.7–15.4)
WBC: 6.6 10*3/uL (ref 3.4–10.8)

## 2019-11-20 LAB — ANEMIA PANEL
Ferritin: 33 ng/mL (ref 15–150)
Folate, Hemolysate: 314 ng/mL
Folate, RBC: 837 ng/mL (ref >498)
Hematocrit: 37.5 % (ref 34.0–46.6)
Iron Saturation: 17 % (ref 15–55)
Iron: 67 ug/dL (ref 27–159)
Retic Ct Pct: 2.1 % (ref 0.6–2.6)
Total Iron Binding Capacity: 387 ug/dL (ref 250–450)
UIBC: 320 ug/dL (ref 131–425)
Vitamin B-12: 421 pg/mL (ref 232–1245)

## 2019-11-20 LAB — LIPID PANEL
Chol/HDL Ratio: 3.2 ratio (ref 0.0–4.4)
Cholesterol, Total: 137 mg/dL (ref 100–199)
HDL: 43 mg/dL (ref >39)
LDL Chol Calc (NIH): 69 mg/dL (ref 0–99)
Triglycerides: 147 mg/dL (ref 0–149)
VLDL Cholesterol Cal: 25 mg/dL (ref 5–40)

## 2019-11-20 LAB — HEMOGLOBIN A1C
Est. average glucose Bld gHb Est-mCnc: 160 mg/dL
Hgb A1c MFr Bld: 7.2 % — ABNORMAL HIGH (ref 4.8–5.6)

## 2019-11-20 LAB — COMPREHENSIVE METABOLIC PANEL
ALT: 35 IU/L — ABNORMAL HIGH (ref 0–32)
AST: 40 IU/L (ref 0–40)
Albumin/Globulin Ratio: 1.4 (ref 1.2–2.2)
Albumin: 4.2 g/dL (ref 3.8–4.8)
Alkaline Phosphatase: 53 IU/L (ref 39–117)
BUN/Creatinine Ratio: 12 (ref 9–23)
BUN: 10 mg/dL (ref 6–24)
Bilirubin Total: 0.4 mg/dL (ref 0.0–1.2)
CO2: 21 mmol/L (ref 20–29)
Calcium: 10.2 mg/dL (ref 8.7–10.2)
Chloride: 101 mmol/L (ref 96–106)
Creatinine, Ser: 0.81 mg/dL (ref 0.57–1.00)
GFR calc Af Amer: 101 mL/min/{1.73_m2} (ref >59)
GFR calc non Af Amer: 88 mL/min/{1.73_m2} (ref >59)
Globulin, Total: 2.9 g/dL (ref 1.5–4.5)
Glucose: 117 mg/dL — ABNORMAL HIGH (ref 65–99)
Potassium: 4.5 mmol/L (ref 3.5–5.2)
Sodium: 136 mmol/L (ref 134–144)
Total Protein: 7.1 g/dL (ref 6.0–8.5)

## 2019-11-20 LAB — TSH: TSH: 2.9 u[IU]/mL (ref 0.450–4.500)

## 2019-11-20 LAB — T3: T3, Total: 124 ng/dL (ref 71–180)

## 2019-11-20 LAB — T4, FREE: Free T4: 1.23 ng/dL (ref 0.82–1.77)

## 2019-11-20 LAB — VITAMIN D 25 HYDROXY (VIT D DEFICIENCY, FRACTURES): Vit D, 25-Hydroxy: 15.2 ng/mL — ABNORMAL LOW (ref 30.0–100.0)

## 2019-11-23 ENCOUNTER — Other Ambulatory Visit: Payer: Self-pay | Admitting: Family Medicine

## 2019-11-23 MED ORDER — ONETOUCH ULTRASOFT LANCETS MISC: 3 refills | Status: DC

## 2019-11-23 MED ORDER — ONETOUCH ULTRA VI STRP: ORAL_STRIP | 3 refills | Status: DC

## 2019-11-23 NOTE — Telephone Encounter (Signed)
Please advise 

## 2019-12-03 ENCOUNTER — Ambulatory Visit (INDEPENDENT_AMBULATORY_CARE_PROVIDER_SITE_OTHER): Payer: BLUE CROSS/BLUE SHIELD | Admitting: Family Medicine

## 2019-12-09 ENCOUNTER — Encounter (INDEPENDENT_AMBULATORY_CARE_PROVIDER_SITE_OTHER): Payer: Self-pay | Admitting: Bariatrics

## 2019-12-09 ENCOUNTER — Other Ambulatory Visit: Payer: Self-pay

## 2019-12-09 ENCOUNTER — Ambulatory Visit (INDEPENDENT_AMBULATORY_CARE_PROVIDER_SITE_OTHER): Payer: BC Managed Care – PPO | Admitting: Bariatrics

## 2019-12-09 VITALS — BP 152/84 | HR 67 | Temp 98.5°F | Ht 67.0 in | Wt 245.0 lb

## 2019-12-09 DIAGNOSIS — R7401 Elevation of levels of liver transaminase levels: Secondary | ICD-10-CM

## 2019-12-09 DIAGNOSIS — Z6838 Body mass index (BMI) 38.0-38.9, adult: Secondary | ICD-10-CM | POA: Insufficient documentation

## 2019-12-09 DIAGNOSIS — E785 Hyperlipidemia, unspecified: Secondary | ICD-10-CM

## 2019-12-09 DIAGNOSIS — E1169 Type 2 diabetes mellitus with other specified complication: Secondary | ICD-10-CM | POA: Diagnosis not present

## 2019-12-09 DIAGNOSIS — E1159 Type 2 diabetes mellitus with other circulatory complications: Secondary | ICD-10-CM

## 2019-12-09 DIAGNOSIS — E119 Type 2 diabetes mellitus without complications: Secondary | ICD-10-CM

## 2019-12-09 DIAGNOSIS — E559 Vitamin D deficiency, unspecified: Secondary | ICD-10-CM

## 2019-12-09 DIAGNOSIS — Z9189 Other specified personal risk factors, not elsewhere classified: Secondary | ICD-10-CM

## 2019-12-09 DIAGNOSIS — I10 Essential (primary) hypertension: Secondary | ICD-10-CM

## 2019-12-09 DIAGNOSIS — I152 Hypertension secondary to endocrine disorders: Secondary | ICD-10-CM

## 2019-12-09 DIAGNOSIS — E669 Obesity, unspecified: Secondary | ICD-10-CM

## 2019-12-09 NOTE — Progress Notes (Signed)
Chief Complaint:   OBESITY Rhonda Le is here to discuss her progress with her obesity treatment plan along with follow-up of her obesity related diagnoses. Rhonda Le is on the [place] + 100 calorie snacks and states she is following her eating plan approximately 50% of the time. Rhonda Le states she is walking 2 miles 35 minutes 3-4 times per week.  Today's visit was #: 2 Starting weight: 245 lbs Starting date: 11/19/2019 Today's weight: 245 lbs Today's date: 12/09/2019 Total lbs lost to date: 0 Total lbs lost since last in-office visit: 0  Interim History: Rhonda Le had her wisdom teeth extracted and has only been on the plan for about 8 days. She reports doing better with her water intake.  Subjective:   Vitamin D deficiency. Last Vitamin D level was 15.2 on 11/19/2019.  Elevated ALT measurement. Rhonda Le has a new dx of elevated ALT. She denies abdominal pain or jaundice and has never been told of any liver problems in the past. She denies Tylenol or ETOH use.  Lab Results  Component Value Date   ALT 35 (H) 11/19/2019   AST 40 11/19/2019   ALKPHOS 53 11/19/2019   BILITOT 0.4 11/19/2019   Type 2 diabetes mellitus without complication, without long-term current use of insulin (Rhonda Le). Rhonda Le is taking metformin. 2-hour postprandials are 135 with no lows or highs.  Lab Results  Component Value Date   HGBA1C 7.2 (H) 11/19/2019   HGBA1C 6.9 (H) 07/13/2019   HGBA1C 6.6 (H) 06/11/2018   Lab Results  Component Value Date   MICROALBUR <0.7 07/13/2019   Rhonda Le 69 11/19/2019   CREATININE 0.81 11/19/2019   No results found for: INSULIN  Hyperlipidemia associated with type 2 diabetes mellitus (Rhonda Le). Rhonda Le is taking Lipitor. Hyperlipidemia is controlled.  Lab Results  Component Value Date   CHOL 137 11/19/2019   HDL 43 11/19/2019   LDLCALC 69 11/19/2019   TRIG 147 11/19/2019   CHOLHDL 3.2 11/19/2019   Lab Results    Component Value Date   ALT 35 (H) 11/19/2019   AST 40 11/19/2019   ALKPHOS 53 11/19/2019   BILITOT 0.4 11/19/2019   The 10-year ASCVD risk score Rhonda Bussing DC Jr., et al., 2013) is: 13.7%   Values used to calculate the score:     Age: 44 years     Sex: Female     Is Non-Hispanic African American: Yes     Diabetic: Yes     Tobacco smoker: No     Systolic Blood Pressure: 0000000 mmHg     Is BP treated: Yes     HDL Cholesterol: 43 mg/dL     Total Cholesterol: 137 mg/dL  Hypertension associated with diabetes (Rhonda Le). Rhonda Le is taking losartan. She reports headache with elevated blood pressure at 152/84.  BP Readings from Last 3 Encounters:  12/09/19 (!) 152/84  11/19/19 120/67  07/13/19 124/82   Lab Results  Component Value Date   CREATININE 0.81 11/19/2019   CREATININE 0.91 07/13/2019   CREATININE 0.92 06/11/2018   At risk for osteoporosis. Rhonda Le is at higher risk of osteopenia and osteoporosis due to Vitamin D deficiency.    Assessment/Plan:   Vitamin D deficiency. Low Vitamin D level contributes to fatigue and are associated with obesity, breast, and colon cancer. She was given a prescription for Vitamin D2 @ 50,000 IU every week #4 with 0 refills and will follow-up for routine testing of Vitamin D, at least 2-3 times per year to avoid over-replacement.  Elevated  ALT measurement. We discussed the likely diagnosis of non-alcoholic fatty liver disease today and how this condition is obesity related. Rhonda Le was educated the importance of weight loss. Rhonda Le agreed to continue with her weight loss efforts with healthier diet and exercise as an essential part of her treatment plan. Will watch over time.  Type 2 diabetes mellitus without complication, without long-term current use of insulin (Rhonda Le). Good blood sugar control is important to decrease the likelihood of diabetic complications such as nephropathy, neuropathy, limb loss, blindness, coronary artery disease,  and death. Intensive lifestyle modification including diet, exercise and weight loss are the first line of treatment for diabetes. Will check fasting blood sugars and 2-hour postprandials. She will decrease carbohydrates and increase protein and healthy fats.  Hyperlipidemia associated with type 2 diabetes mellitus (Rhonda Le). Cardiovascular risk and specific lipid/LDL goals reviewed.  We discussed several lifestyle modifications today and Rhonda Le will continue to work on diet, exercise and weight loss efforts. Orders and follow up as documented in patient record. She will continue Lipitor as directed.  Counseling Intensive lifestyle modifications are the first line treatment for this issue. . Dietary changes: Increase soluble fiber. Decrease simple carbohydrates. . Exercise changes: Moderate to vigorous-intensity aerobic activity 150 minutes per week if tolerated. . Lipid-lowering medications: see documented in medical record.  Hypertension associated with diabetes (Rhonda Le). Rhonda Le is working on healthy weight loss and exercise to improve blood pressure control. We will watch for signs of hypotension as she continues her lifestyle modifications. She will continue her medications as directed.  At risk for osteoporosis. Rhonda Le was given approximately 15 minutes of osteoporosis prevention counseling today. Rhonda Le is at risk for osteopenia and osteoporosis due to her Vitamin D deficiency. She was encouraged to take her Vitamin D and follow her higher calcium diet and increase strengthening exercise to help strengthen her bones and decrease her risk of osteopenia and osteoporosis.  Repetitive spaced learning was employed today to elicit superior memory formation and behavioral change.  Class 2 severe obesity with serious comorbidity and body mass index (BMI) of 38.0 to 38.9 in adult, unspecified obesity type ([place])  Rhonda Le is currently in the action stage of change. As such, her  goal is to continue with weight loss efforts. She has agreed to the [employer] + 100 calorie snacks.   She will work on meal planning.  We independently reviewed with the patient lab results from 11/19/2019 including lipids, CMP, iron and anemia profile, Vitamin D, B12, CBC, A1c, and thyroid panel.  Exercise goals: All adults should avoid inactivity. Some physical activity is better than none, and adults who participate in any amount of physical activity gain some health benefits.  Behavioral modification strategies: increasing lean protein intake, decreasing simple carbohydrates, increasing vegetables, increasing water intake, decreasing eating out, no skipping meals, meal planning and cooking strategies and keeping healthy foods in the home.  Derrisha has agreed to follow-up with our clinic in 2 weeks. She was informed of the importance of frequent follow-up visits to maximize her success with intensive lifestyle modifications for her multiple health conditions.   Objective:   Blood pressure (!) 152/84, pulse 67, temperature 98.5 F (36.9 C), temperature source Oral, height 5\' 7"  (1.702 m), weight 245 lb (111.1 kg), last menstrual period 11/25/2019. Body mass index is 38.37 kg/m.  General: Cooperative, alert, well developed, in no acute distress. HEENT: Conjunctivae and lids unremarkable. Cardiovascular: Regular rhythm.  Lungs: Normal work of breathing. Neurologic: No focal deficits.   Lab Results  Component Value Date   CREATININE 0.81 11/19/2019   BUN 10 11/19/2019   NA 136 11/19/2019   K 4.5 11/19/2019   CL 101 11/19/2019   CO2 21 11/19/2019   Lab Results  Component Value Date   ALT 35 (H) 11/19/2019   AST 40 11/19/2019   ALKPHOS 53 11/19/2019   BILITOT 0.4 11/19/2019   Lab Results  Component Value Date   HGBA1C 7.2 (H) 11/19/2019   HGBA1C 6.9 (H) 07/13/2019   HGBA1C 6.6 (H) 06/11/2018   HGBA1C 6.1 (H) 10/21/2017   No results found for: INSULIN Lab  Results  Component Value Date   TSH 2.900 11/19/2019   Lab Results  Component Value Date   CHOL 137 11/19/2019   HDL 43 11/19/2019   LDLCALC 69 11/19/2019   TRIG 147 11/19/2019   CHOLHDL 3.2 11/19/2019   Lab Results  Component Value Date   WBC 6.6 11/19/2019   HGB 12.0 11/19/2019   HCT 37.5 11/19/2019   MCV 90 11/19/2019   PLT 329 11/19/2019   Lab Results  Component Value Date   IRON 67 11/19/2019   TIBC 387 11/19/2019   FERRITIN 33 11/19/2019   Attestation Statements:   Reviewed by clinician on day of visit: allergies, medications, problem list, medical history, surgical history, family history, social history, and previous encounter notes.  Migdalia Dk, am acting as Location manager for [employer], DO   I have reviewed the above documentation for accuracy and completeness, and I agree with the above. Jearld Lesch, DO

## 2019-12-23 ENCOUNTER — Other Ambulatory Visit: Payer: Self-pay

## 2019-12-23 ENCOUNTER — Ambulatory Visit (INDEPENDENT_AMBULATORY_CARE_PROVIDER_SITE_OTHER): Payer: BC Managed Care – PPO | Admitting: Family Medicine

## 2019-12-23 ENCOUNTER — Encounter (INDEPENDENT_AMBULATORY_CARE_PROVIDER_SITE_OTHER): Payer: Self-pay | Admitting: Family Medicine

## 2019-12-23 VITALS — BP 117/76 | HR 79 | Temp 98.5°F | Ht 67.0 in | Wt 242.0 lb

## 2019-12-23 DIAGNOSIS — E1169 Type 2 diabetes mellitus with other specified complication: Secondary | ICD-10-CM | POA: Diagnosis not present

## 2019-12-23 DIAGNOSIS — E1159 Type 2 diabetes mellitus with other circulatory complications: Secondary | ICD-10-CM | POA: Diagnosis not present

## 2019-12-23 DIAGNOSIS — Z6838 Body mass index (BMI) 38.0-38.9, adult: Secondary | ICD-10-CM

## 2019-12-23 DIAGNOSIS — I10 Essential (primary) hypertension: Secondary | ICD-10-CM

## 2019-12-23 DIAGNOSIS — E669 Obesity, unspecified: Secondary | ICD-10-CM

## 2019-12-23 DIAGNOSIS — I152 Hypertension secondary to endocrine disorders: Secondary | ICD-10-CM

## 2019-12-23 NOTE — Progress Notes (Signed)
Chief Complaint:   OBESITY Rhonda Le is here to discuss her progress with her obesity treatment plan along with follow-up of her obesity related diagnoses. Rhonda Le is on the [place] and states she is following her eating plan approximately 95% of the time. Rhonda Le states she is doing cardio/walking 45-60 minutes 3 times per week.  Today's visit was #: 3 Starting weight: 245 lbs Starting date: 11/19/2019 Today's weight: 242 lbs Today's date: 12/23/2019 Total lbs lost to date: 3 Total lbs lost since last in-office visit: 3  Interim History: Rhonda Le is happy with her weight loss today. She is not skipping meals as she had in the past before starting on the program. She does report cravings but is able to control them.  Subjective:   Type 2 diabetes mellitus without complication, without long-term current use of insulin ([place]). Diabetes is not well controlled. She is on metformin only. Fasting blood sugars range between 100 and 110 with 2-hour postprandials averaging 130 to 140. No polyphagia.  Lab Results  Component Value Date   HGBA1C 7.2 (H) 11/19/2019   HGBA1C 6.9 (H) 07/13/2019   HGBA1C 6.6 (H) 06/11/2018   Lab Results  Component Value Date   MICROALBUR <0.7 07/13/2019   LDLCALC 69 11/19/2019   CREATININE 0.81 11/19/2019   No results found for: INSULIN  Essential hypertension. Blood pressure is well controlled on losartan.  BP Readings from Last 3 Encounters:  12/23/19 117/76  12/09/19 (!) 152/84  11/19/19 120/67   Lab Results  Component Value Date   CREATININE 0.81 11/19/2019   CREATININE 0.91 07/13/2019   CREATININE 0.92 06/11/2018   Assessment/Plan:   Type 2 diabetes mellitus without complication, without long-term current use of insulin ([place]). Good blood sugar control is important to decrease the likelihood of diabetic complications such as nephropathy, neuropathy, limb loss, blindness, coronary artery disease, and death.  Intensive lifestyle modification including diet, exercise and weight loss are the first line of treatment for diabetes. Rhonda Le will continue metformin as directed.  Essential hypertension. Rhonda Le is working on healthy weight loss and exercise to improve blood pressure control. We will watch for signs of hypotension as she continues her lifestyle modifications. She will continue losartan as directed.  Class 2 severe obesity due to excess calories with serious comorbidity and body mass index (BMI) of 38.0 to 38.9 in adult [facility]).  Rhonda Le is currently in the action stage of change. As such, her goal is to continue with weight loss efforts. She has agreed to the Category 2 Plan + 100 calories or the Pescatarian Plan + 100 calories.  She does eat pork, chicken, and [place] as well as seafood so we will give her Category 2 + 100 calories as an option.   Exercise goals: Rhonda Le will continue her current exercise regimen.  Behavioral modification strategies: increasing lean protein intake and planning for success.  Rhonda Le has agreed to follow-up with our clinic in 2 weeks. She was informed of the importance of frequent follow-up visits to maximize her success with intensive lifestyle modifications for her multiple health conditions.   Objective:   Blood pressure 117/76, pulse 79, temperature 98.5 F (36.9 C), temperature source Oral, height 5\' 7"  (1.702 m), weight 242 lb (109.8 kg), last menstrual period 11/25/2019, SpO2 99 %. Body mass index is 37.9 kg/m.  General: Cooperative, alert, well developed, in no acute distress. HEENT: Conjunctivae and lids unremarkable. Cardiovascular: Regular rhythm.  Lungs: Normal work of breathing. Neurologic: No focal deficits.  Lab Results  Component Value Date   CREATININE 0.81 11/19/2019   BUN 10 11/19/2019   NA 136 11/19/2019   K 4.5 11/19/2019   CL 101 11/19/2019   CO2 21 11/19/2019   Lab Results  Component Value Date    ALT 35 (H) 11/19/2019   AST 40 11/19/2019   ALKPHOS 53 11/19/2019   BILITOT 0.4 11/19/2019   Lab Results  Component Value Date   HGBA1C 7.2 (H) 11/19/2019   HGBA1C 6.9 (H) 07/13/2019   HGBA1C 6.6 (H) 06/11/2018   HGBA1C 6.1 (H) 10/21/2017   No results found for: INSULIN Lab Results  Component Value Date   TSH 2.900 11/19/2019   Lab Results  Component Value Date   CHOL 137 11/19/2019   HDL 43 11/19/2019   LDLCALC 69 11/19/2019   TRIG 147 11/19/2019   CHOLHDL 3.2 11/19/2019   Lab Results  Component Value Date   WBC 6.6 11/19/2019   HGB 12.0 11/19/2019   HCT 37.5 11/19/2019   MCV 90 11/19/2019   PLT 329 11/19/2019   Lab Results  Component Value Date   IRON 67 11/19/2019   TIBC 387 11/19/2019   FERRITIN 33 11/19/2019   Attestation Statements:   Reviewed by clinician on day of visit: allergies, medications, problem list, medical history, surgical history, family history, social history, and previous encounter notes.  IMichaelene Song, am acting as Location manager for [employer], FNP   I have reviewed the above documentation for accuracy and completeness, and I agree with the above. -  Georgianne Fick, FNP

## 2020-01-06 ENCOUNTER — Ambulatory Visit (INDEPENDENT_AMBULATORY_CARE_PROVIDER_SITE_OTHER): Payer: BC Managed Care – PPO | Admitting: Family Medicine

## 2020-01-06 ENCOUNTER — Encounter (INDEPENDENT_AMBULATORY_CARE_PROVIDER_SITE_OTHER): Payer: Self-pay | Admitting: Family Medicine

## 2020-01-06 ENCOUNTER — Other Ambulatory Visit: Payer: Self-pay

## 2020-01-06 VITALS — BP 142/78 | HR 68 | Temp 98.5°F | Ht 67.0 in | Wt 244.0 lb

## 2020-01-06 DIAGNOSIS — Z6838 Body mass index (BMI) 38.0-38.9, adult: Secondary | ICD-10-CM

## 2020-01-06 DIAGNOSIS — E1169 Type 2 diabetes mellitus with other specified complication: Secondary | ICD-10-CM | POA: Diagnosis not present

## 2020-01-06 DIAGNOSIS — E1159 Type 2 diabetes mellitus with other circulatory complications: Secondary | ICD-10-CM

## 2020-01-06 DIAGNOSIS — I152 Hypertension secondary to endocrine disorders: Secondary | ICD-10-CM

## 2020-01-06 DIAGNOSIS — I10 Essential (primary) hypertension: Secondary | ICD-10-CM | POA: Diagnosis not present

## 2020-01-07 ENCOUNTER — Ambulatory Visit: Payer: BC Managed Care – PPO | Attending: Family

## 2020-01-07 ENCOUNTER — Encounter (INDEPENDENT_AMBULATORY_CARE_PROVIDER_SITE_OTHER): Payer: Self-pay | Admitting: Family Medicine

## 2020-01-07 DIAGNOSIS — R7303 Prediabetes: Secondary | ICD-10-CM | POA: Insufficient documentation

## 2020-01-07 DIAGNOSIS — Z23 Encounter for immunization: Secondary | ICD-10-CM

## 2020-01-07 DIAGNOSIS — E119 Type 2 diabetes mellitus without complications: Secondary | ICD-10-CM | POA: Insufficient documentation

## 2020-01-07 NOTE — Progress Notes (Signed)
   Covid-19 Vaccination Clinic  Name:  Rylei Giasson    MRN: [id number] DOB: 11/21/1975  01/07/2020  Ms. Spurgeon was observed post Covid-19 immunization for 15 minutes without incident. She was provided with Vaccine Information Sheet and instruction to access the V-Safe system.   Ms. Frerking was instructed to call 911 with any severe reactions post vaccine: Marland Kitchen Difficulty breathing  . Swelling of face and throat  . A fast heartbeat  . A bad rash all over body  . Dizziness and weakness   Immunizations Administered    Name Date Dose VIS Date Route   Moderna COVID-19 Vaccine 01/07/2020 12:49 PM 0.5 mL 09/15/2019 Intramuscular   Manufacturer: Moderna   Lot: [id number]   [id number]

## 2020-01-07 NOTE — Progress Notes (Signed)
Chief Complaint:   OBESITY Rhonda Le is here to discuss her progress with her obesity treatment plan along with follow-up of her obesity related diagnoses. Rhonda Le is on the Category 2 Plan + 100 calories or the Pescatarian Plan + 100 calories and states she is following her eating plan approximately 92% of the time. Rhonda Le states she is doing cardio and HIT for 30 minutes 4 times per week.  Today's visit was #: 4 Starting weight: 245 lbs Starting date: 11/19/2019 Today's weight: 244 lbs Today's date: 01/06/2020 Total lbs lost to date: 5 Total lbs lost since last in-office visit: 0  Interim History: Rhonda Le did enjoy eating the Category 2 food as an option. She had previously been on the pescatarian plan. She does not always eat all of the snack calories. She is disappointed that she did not lose weight, despite adhering to the plan very well.  Subjective:   1. Diabetes Well-controlled on metformin only. She denies polyphagia.  Lab Results  Component Value Date   HGBA1C 7.2 (H) 11/19/2019   HGBA1C 6.9 (H) 07/13/2019   HGBA1C 6.6 (H) 06/11/2018   Lab Results  Component Value Date   MICROALBUR <0.7 07/13/2019   LDLCALC 69 11/19/2019   CREATININE 0.81 11/19/2019   No results found for: INSULIN . Lab Results  Component Value Date   HGBA1C 7.2 (H) 11/19/2019    2. Essential hypertension Rhonda Le's blood pressure is elevated today, but her hypertension is normally well controlled. She is on losartan. BP Readings from Last 3 Encounters:  01/06/20 (!) 142/78  12/23/19 117/76  12/09/19 (!) 152/84     Assessment/Plan:   1. DM Good blood sugar control is important to decrease the likelihood of diabetic complications such as nephropathy, neuropathy, limb loss, blindness, coronary artery disease, and death. Intensive lifestyle modification including diet, exercise and weight loss are the first line of treatment for diabetes.   2. Essential  hypertension Rhonda Le is working on healthy weight loss and exercise to improve blood pressure control. We will watch for signs of hypotension as she continues her lifestyle modifications. Rhonda Le will continue all of her medications and will continue to follow up.  3. Class 2 severe obesity with serious comorbidity and body mass index (BMI) of 38.0 to 38.9 in adult, unspecified obesity type ([place]) Rhonda Le is currently in the action stage of change. As such, her goal is to continue with weight loss efforts. She has agreed to the Category 2 Plan + 300 calories or the Pescatarian Plan + 300 calories.   Encouragement was provided today.  Exercise goals: As is.  Behavioral modification strategies: better snacking choices and planning for success.  Rhonda Le has agreed to follow-up with our clinic in 2 weeks. She was informed of the importance of frequent follow-up visits to maximize her success with intensive lifestyle modifications for her multiple health conditions.   Objective:   Blood pressure (!) 142/78, pulse 68, temperature 98.5 F (36.9 C), temperature source Oral, height 5\' 7"  (1.702 m), weight 244 lb (110.7 kg), SpO2 100 %. Body mass index is 38.22 kg/m.  General: Cooperative, alert, well developed, in no acute distress. HEENT: Conjunctivae and lids unremarkable. Cardiovascular: Regular rhythm.  Lungs: Normal work of breathing. Neurologic: No focal deficits.   Lab Results  Component Value Date   CREATININE 0.81 11/19/2019   BUN 10 11/19/2019   NA 136 11/19/2019   K 4.5 11/19/2019   CL 101 11/19/2019   CO2 21 11/19/2019   Lab Results  Component Value Date   ALT 35 (H) 11/19/2019   AST 40 11/19/2019   ALKPHOS 53 11/19/2019   BILITOT 0.4 11/19/2019   Lab Results  Component Value Date   HGBA1C 7.2 (H) 11/19/2019   HGBA1C 6.9 (H) 07/13/2019   HGBA1C 6.6 (H) 06/11/2018   HGBA1C 6.1 (H) 10/21/2017   No results found for: INSULIN Lab Results   Component Value Date   TSH 2.900 11/19/2019   Lab Results  Component Value Date   CHOL 137 11/19/2019   HDL 43 11/19/2019   LDLCALC 69 11/19/2019   TRIG 147 11/19/2019   CHOLHDL 3.2 11/19/2019   Lab Results  Component Value Date   WBC 6.6 11/19/2019   HGB 12.0 11/19/2019   HCT 37.5 11/19/2019   MCV 90 11/19/2019   PLT 329 11/19/2019   Lab Results  Component Value Date   IRON 67 11/19/2019   TIBC 387 11/19/2019   FERRITIN 33 11/19/2019   Attestation Statements:   Reviewed by clinician on day of visit: allergies, medications, problem list, medical history, surgical history, family history, social history, and previous encounter notes.   Wilhemena Durie, am acting as Location manager for [employer], FNP-C.  I have reviewed the above documentation for accuracy and completeness, and I agree with the above. -  Georgianne Fick, FNP

## 2020-01-11 ENCOUNTER — Encounter: Payer: Self-pay | Admitting: Family Medicine

## 2020-01-11 ENCOUNTER — Ambulatory Visit: Payer: BC Managed Care – PPO | Admitting: Family Medicine

## 2020-01-11 ENCOUNTER — Other Ambulatory Visit: Payer: Self-pay

## 2020-01-11 VITALS — BP 110/64 | HR 76 | Temp 96.6°F | Ht 67.0 in | Wt 248.1 lb

## 2020-01-11 DIAGNOSIS — E669 Obesity, unspecified: Secondary | ICD-10-CM | POA: Diagnosis not present

## 2020-01-11 DIAGNOSIS — E559 Vitamin D deficiency, unspecified: Secondary | ICD-10-CM | POA: Diagnosis not present

## 2020-01-11 DIAGNOSIS — E1169 Type 2 diabetes mellitus with other specified complication: Secondary | ICD-10-CM

## 2020-01-11 DIAGNOSIS — I10 Essential (primary) hypertension: Secondary | ICD-10-CM

## 2020-01-11 MED ORDER — VITAMIN D (ERGOCALCIFEROL) 1.25 MG (50000 UNIT) PO CAPS: 50000.0000 [IU] | ORAL_CAPSULE | ORAL | 0 refills | Status: DC

## 2020-01-11 MED ORDER — METFORMIN HCL 1000 MG PO TABS: 1000.0000 mg | ORAL_TABLET | Freq: Two times a day (BID) | ORAL | 1 refills | Status: DC

## 2020-01-11 NOTE — Progress Notes (Signed)
Subjective:   Chief Complaint  Patient presents with  . Follow-up    Rhonda Le is a 44 y.o. female here for follow-up of diabetes.   Rhonda Le's self monitored glucose range is low 100's.  Patient denies hypoglycemic reactions. She checks her glucose levels 3 times per week. Patient does not require insulin.   Medications include: metformin 500 mg bid Diet is healthy.  Exercise: HIIT  Hypertension Patient presents for hypertension follow up. She does not monitor home blood pressures. She is compliant with medication- losartan 100 mg/d. Patient has these side effects of medication: none Diet/exercise as above.   Past Medical History:  Diagnosis Date  . Anemia   . Chest pain   . Constipation   . Diabetes mellitus without complication ([place])   . GERD (gastroesophageal reflux disease)   . High cholesterol   . Hypertension   . Morbid obesity ([place])   . Stroke [facility])      Related testing: Date of retinal exam: Done Pneumovax: done Flu Shot: done  Review of Systems: Pulmonary:  No SOB Cardiovascular:  No chest pain  Objective:  BP 110/64 (BP Location: Left Arm, Patient Position: Sitting, Cuff Size: Large)   Pulse 76   Temp (!) 96.6 F (35.9 C) (Temporal)   Ht 5\' 7"  (1.702 m)   Wt 248 lb 2 oz (112.5 kg)   SpO2 98%   BMI 38.86 kg/m  General:  Well developed, well nourished, in no apparent distress Head:  Normocephalic, atraumatic Eyes:  Pupils equal and round, sclera anicteric without injection  Lungs:  CTAB, no access msc use Cardio:  RRR, no bruits, no LE edema Psych: Age appropriate judgment and insight  Assessment:   Type 2 diabetes mellitus with obesity (HCC) - Plan: Hemoglobin A1c  Essential hypertension  Vitamin D deficiency - Plan: VITAMIN D 25 Hydroxy (Vit-D Deficiency, Fractures)   Plan:   Ck labs in 2 months. F/u pending that. She has already increased the dose of her Metformin over past mo to 1000 mg bid from 500 mg bid.  Counseled  on diet and exercise. F/u in 6 mo. The patient voiced understanding and agreement to the plan.  [place], DO 01/11/20 1:05 PM

## 2020-01-11 NOTE — Patient Instructions (Signed)
Give us 2-3 business days to get the results of your labs back.   Keep the diet clean and stay active.  Let us know if you need anything. 

## 2020-01-21 ENCOUNTER — Ambulatory Visit (INDEPENDENT_AMBULATORY_CARE_PROVIDER_SITE_OTHER): Payer: BC Managed Care – PPO | Admitting: Family Medicine

## 2020-01-21 ENCOUNTER — Other Ambulatory Visit: Payer: Self-pay

## 2020-01-21 ENCOUNTER — Encounter (INDEPENDENT_AMBULATORY_CARE_PROVIDER_SITE_OTHER): Payer: Self-pay | Admitting: Family Medicine

## 2020-01-21 VITALS — BP 130/81 | HR 74 | Temp 98.1°F | Ht 67.0 in | Wt 247.0 lb

## 2020-01-21 DIAGNOSIS — Z6838 Body mass index (BMI) 38.0-38.9, adult: Secondary | ICD-10-CM

## 2020-01-21 DIAGNOSIS — E1165 Type 2 diabetes mellitus with hyperglycemia: Secondary | ICD-10-CM

## 2020-01-21 NOTE — Progress Notes (Signed)
Chief Complaint:   OBESITY Rhonda Le is here to discuss her progress with her obesity treatment plan along with follow-up of her obesity related diagnoses. Rhonda Le is on the Category 2 Plan + 300 calories or the Pescatarian Plan + 300 calories and states she is following her eating plan approximately 87% of the time. Rhonda Le states she is doing cardio and HIT for 30 minutes 3-4 times per week.  Today's visit was #: 5 Starting weight: 245 lbs Starting date: 11/19/2019 Today's weight: 247 lbs Today's date: 01/21/2020 Total lbs lost to date: 0 Total lbs lost since last in-office visit: 0  Interim History: Rhonda Le reports sticking to the plan very well. She is eating about 1460 calories per day. She journals with [place] even though she is on a category plan. According to food recall she is deviating significantly from the plan. She feels her clothes are fitting differently however because she has been working out quite a bit. She notes hunger after exercise.  Bioimpedance scale results show increase of 6 lbs of muscle mass and a decrease of 5 lbs in fat since first OV.  Subjective:   1. Type 2 diabetes mellitus with hyperglycemia, without long-term current use of insulin (HCC) Rhonda Le's last A1c was 7.2. Her CBGs range between 90 and low 100's. She is on metformin only.  Assessment/Plan:   1. Type 2 diabetes mellitus with hyperglycemia, without long-term current use of insulin (HCC) Good blood sugar control is important to decrease the likelihood of diabetic complications such as nephropathy, neuropathy, limb loss, blindness, coronary artery disease, and death. Intensive lifestyle modification including diet, exercise and weight loss are the first line of treatment for diabetes. Rhonda Le will continue metformin, and will follow up.  2. Class 2 severe obesity with serious comorbidity and body mass index (BMI) of 38.0 to 38.9 in adult, unspecified obesity type  ([place]) Rhonda Le is currently in the action stage of change. As such, her goal is to continue with weight loss efforts. She has agreed to switch to keeping a food journal and adhering to recommended goals of 1400-1500 calories and 90 grams of protein daily.   I advised Rhonda Le to decrease intensity and length of exercise to decrease hunger. We discussed that adhering to the meal plan is much more important than exercise ofr weight loss.   Exercise goals: As is.  Behavioral modification strategies: increasing lean protein intake, decreasing simple carbohydrates, planning for success and keeping a strict food journal.  Rhonda Le has agreed to follow-up with our clinic in 2 to 3 weeks. She was informed of the importance of frequent follow-up visits to maximize her success with intensive lifestyle modifications for her multiple health conditions.   Objective:   Blood pressure 130/81, pulse 74, temperature 98.1 F (36.7 C), temperature source Oral, height 5\' 7"  (1.702 m), weight 247 lb (112 kg), last menstrual period 01/15/2020, SpO2 98 %. Body mass index is 38.69 kg/m.  General: Cooperative, alert, well developed, in no acute distress. HEENT: Conjunctivae and lids unremarkable. Cardiovascular: Regular rhythm.  Lungs: Normal work of breathing. Neurologic: No focal deficits.   Lab Results  Component Value Date   CREATININE 0.81 11/19/2019   BUN 10 11/19/2019   NA 136 11/19/2019   K 4.5 11/19/2019   CL 101 11/19/2019   CO2 21 11/19/2019   Lab Results  Component Value Date   ALT 35 (H) 11/19/2019   AST 40 11/19/2019   ALKPHOS 53 11/19/2019   BILITOT 0.4 11/19/2019  Lab Results  Component Value Date   HGBA1C 7.2 (H) 11/19/2019   HGBA1C 6.9 (H) 07/13/2019   HGBA1C 6.6 (H) 06/11/2018   HGBA1C 6.1 (H) 10/21/2017   No results found for: INSULIN Lab Results  Component Value Date   TSH 2.900 11/19/2019   Lab Results  Component Value Date   CHOL 137 11/19/2019   HDL  43 11/19/2019   LDLCALC 69 11/19/2019   TRIG 147 11/19/2019   CHOLHDL 3.2 11/19/2019   Lab Results  Component Value Date   WBC 6.6 11/19/2019   HGB 12.0 11/19/2019   HCT 37.5 11/19/2019   MCV 90 11/19/2019   PLT 329 11/19/2019   Lab Results  Component Value Date   IRON 67 11/19/2019   TIBC 387 11/19/2019   FERRITIN 33 11/19/2019   Attestation Statements:   Reviewed by clinician on day of visit: allergies, medications, problem list, medical history, surgical history, family history, social history, and previous encounter notes.   Wilhemena Durie, am acting as Location manager for [employer], FNP-C.  I have reviewed the above documentation for accuracy and completeness, and I agree with the above. -  Georgianne Fick, FNP

## 2020-02-09 ENCOUNTER — Ambulatory Visit: Payer: BC Managed Care – PPO | Attending: Family

## 2020-02-09 DIAGNOSIS — Z23 Encounter for immunization: Secondary | ICD-10-CM

## 2020-02-09 NOTE — Progress Notes (Signed)
   Covid-19 Vaccination Clinic  Name:  Yulanda Resendez    MRN: [id number] DOB: [birth year]  02/09/2020  Ms. Skeete was observed post Covid-19 immunization for 15 minutes without incident. She was provided with Vaccine Information Sheet and instruction to access the V-Safe system.   Ms. Whitner was instructed to call 911 with any severe reactions post vaccine: Marland Kitchen Difficulty breathing  . Swelling of face and throat  . A fast heartbeat  . A bad rash all over body  . Dizziness and weakness   Immunizations Administered    Name Date Dose VIS Date Route   Moderna COVID-19 Vaccine 02/09/2020  1:00 PM 0.5 mL 09/2019 Intramuscular   Manufacturer: Moderna   Lot: [id number]   [id number]

## 2020-02-11 ENCOUNTER — Other Ambulatory Visit: Payer: Self-pay

## 2020-02-11 ENCOUNTER — Ambulatory Visit (INDEPENDENT_AMBULATORY_CARE_PROVIDER_SITE_OTHER): Payer: BC Managed Care – PPO | Admitting: Family Medicine

## 2020-02-11 ENCOUNTER — Encounter (INDEPENDENT_AMBULATORY_CARE_PROVIDER_SITE_OTHER): Payer: Self-pay | Admitting: Family Medicine

## 2020-02-11 VITALS — BP 101/63 | HR 84 | Temp 98.7°F | Ht 67.0 in | Wt 243.0 lb

## 2020-02-11 DIAGNOSIS — E1165 Type 2 diabetes mellitus with hyperglycemia: Secondary | ICD-10-CM

## 2020-02-11 DIAGNOSIS — Z6838 Body mass index (BMI) 38.0-38.9, adult: Secondary | ICD-10-CM | POA: Diagnosis not present

## 2020-02-11 NOTE — Progress Notes (Signed)
Chief Complaint:   OBESITY Rhonda Le is here to discuss her progress with her obesity treatment plan along with follow-up of her obesity related diagnoses. Rhonda Le is on keeping a food journal and adhering to recommended goals of 1400-1500 calories and 90 grams of protein daily and states she is following her eating plan approximately 95% of the time. Rhonda Le states she is cardio and strength training for 45 minutes 3 times per week.  Today's visit was #: 6 Starting weight: 245 lbs Starting date: 11/19/2019 Today's weight: 243 lbs Today's date: 02/11/2020 Total lbs lost to date: 2 Total lbs lost since last in-office visit: 4  Interim History: Rhonda Le struggled to get in protein the first week after our last appointment but is now doing much better with it. She is journaling consistently and meeting protein goals. She likes journaling. Her husband is also journaling with [employer].  Subjective:   1. Type 2 diabetes mellitus with hyperglycemia, without long-term current use of insulin (HCC) Rhonda Le's last A1c was elevated at 7.2. She checks CBGs 3 times per week, and ranges between 113 and 143. She denies hypoglycemia on metformin.  Assessment/Plan:   1. Type 2 diabetes mellitus with hyperglycemia, without long-term current use of insulin (HCC) Good blood sugar control is important to decrease the likelihood of diabetic complications such as nephropathy, neuropathy, limb loss, blindness, coronary artery disease, and death. Intensive lifestyle modification including diet, exercise and weight loss are the first line of treatment for diabetes. Rhonda Le agreed to continue metformin, and we will recheck labs at her next visit.  2. Class 2 severe obesity with serious comorbidity and body mass index (BMI) of 38.0 to 38.9 in adult, unspecified obesity type ([place]) Rhonda Le is currently in the action stage of change. As such, her goal is to continue with weight  loss efforts. She has agreed to keeping a food journal and adhering to recommended goals of 1400-1500 calories and 90 grams of protein daily.   Handout given today: Recipes.  Exercise goals: As is.  Behavioral modification strategies: increasing lean protein intake, increasing water intake and keeping a strict food journal.  Rhonda Le has agreed to follow-up with our clinic in 2 to 3 weeks. She was informed of the importance of frequent follow-up visits to maximize her success with intensive lifestyle modifications for her multiple health conditions.   Objective:   Blood pressure 101/63, pulse 84, temperature 98.7 F (37.1 C), temperature source Oral, height 5\' 7"  (1.702 m), weight 243 lb (110.2 kg), last menstrual period 01/15/2020, SpO2 97 %. Body mass index is 38.06 kg/m.  General: Cooperative, alert, well developed, in no acute distress. HEENT: Conjunctivae and lids unremarkable. Cardiovascular: Regular rhythm.  Lungs: Normal work of breathing. Neurologic: No focal deficits.   Lab Results  Component Value Date   CREATININE 0.81 11/19/2019   BUN 10 11/19/2019   NA 136 11/19/2019   K 4.5 11/19/2019   CL 101 11/19/2019   CO2 21 11/19/2019   Lab Results  Component Value Date   ALT 35 (H) 11/19/2019   AST 40 11/19/2019   ALKPHOS 53 11/19/2019   BILITOT 0.4 11/19/2019   Lab Results  Component Value Date   HGBA1C 7.2 (H) 11/19/2019   HGBA1C 6.9 (H) 07/13/2019   HGBA1C 6.6 (H) 06/11/2018   HGBA1C 6.1 (H) 10/21/2017   No results found for: INSULIN Lab Results  Component Value Date   TSH 2.900 11/19/2019   Lab Results  Component Value Date   CHOL  137 11/19/2019   HDL 43 11/19/2019   LDLCALC 69 11/19/2019   TRIG 147 11/19/2019   CHOLHDL 3.2 11/19/2019   Lab Results  Component Value Date   WBC 6.6 11/19/2019   HGB 12.0 11/19/2019   HCT 37.5 11/19/2019   MCV 90 11/19/2019   PLT 329 11/19/2019   Lab Results  Component Value Date   IRON 67 11/19/2019    TIBC 387 11/19/2019   FERRITIN 33 11/19/2019   Attestation Statements:   Reviewed by clinician on day of visit: allergies, medications, problem list, medical history, surgical history, family history, social history, and previous encounter notes.   Wilhemena Durie, am acting as Location manager for [employer], FNP-C.  I have reviewed the above documentation for accuracy and completeness, and I agree with the above. -  Georgianne Fick, FNP

## 2020-03-03 ENCOUNTER — Ambulatory Visit (INDEPENDENT_AMBULATORY_CARE_PROVIDER_SITE_OTHER): Payer: BC Managed Care – PPO | Admitting: Family Medicine

## 2020-03-03 ENCOUNTER — Other Ambulatory Visit: Payer: Self-pay

## 2020-03-03 ENCOUNTER — Encounter (INDEPENDENT_AMBULATORY_CARE_PROVIDER_SITE_OTHER): Payer: Self-pay | Admitting: Family Medicine

## 2020-03-03 VITALS — BP 111/65 | HR 83 | Temp 98.1°F | Ht 67.0 in | Wt 243.0 lb

## 2020-03-03 DIAGNOSIS — E1165 Type 2 diabetes mellitus with hyperglycemia: Secondary | ICD-10-CM

## 2020-03-03 DIAGNOSIS — E7849 Other hyperlipidemia: Secondary | ICD-10-CM

## 2020-03-03 DIAGNOSIS — Z6838 Body mass index (BMI) 38.0-38.9, adult: Secondary | ICD-10-CM

## 2020-03-03 DIAGNOSIS — E559 Vitamin D deficiency, unspecified: Secondary | ICD-10-CM | POA: Diagnosis not present

## 2020-03-03 DIAGNOSIS — Z9189 Other specified personal risk factors, not elsewhere classified: Secondary | ICD-10-CM

## 2020-03-03 NOTE — Progress Notes (Signed)
Chief Complaint:   OBESITY Rhonda Le is here to discuss her progress with her obesity treatment plan along with follow-up of her obesity related diagnoses. Rhonda Le is on keeping a food journal and adhering to recommended goals of 1400-1500 calories and 90 grams of protein daily and states she is following her eating plan approximately 95-96% of the time. Rhonda Le states she is walking for 30 minutes 3-4 times per week.  Today's visit was #: 7 Starting weight: 245 lbs Starting date: 11/19/2019 Today's weight: 243 lbs Today's date: 03/03/2020 Total lbs lost to date: 2 Total lbs lost since last in-office visit: 0  Interim History: Rhonda Le has lacked an appetite for the past few weeks. She struggles to get in calories and protein, and is not meeting her protein goals. She is skipping meals, and she feels that she may not be eating due to stress. She notes less appetite when she does not do weight training.  Subjective:   1. Vitamin D deficiency Rhonda Le's last Vit D level was very low at 15.2 on 11/19/2019. She is on prescription Vit D.  2. Other hyperlipidemia Rhonda Le has hyperlipidemia and has been trying to improve her cholesterol levels with intensive lifestyle modification including a low saturated fat diet, exercise and weight loss. She is on Lipitor 40 mg daily, and her last lipid panel was within normal limits. She denies any chest pain or shortness of breath.  Lab Results  Component Value Date   ALT 35 (H) 11/19/2019   AST 40 11/19/2019   ALKPHOS 53 11/19/2019   BILITOT 0.4 11/19/2019   Lab Results  Component Value Date   CHOL 137 11/19/2019   HDL 43 11/19/2019   LDLCALC 69 11/19/2019   TRIG 147 11/19/2019   CHOLHDL 3.2 11/19/2019  The 10-year ASCVD risk score Rhonda Le DC Jr., et al., 2013) is: 3.6%   Values used to calculate the score:     Age: 44 years     Sex: Female     Is Non-Hispanic African American: Yes     Diabetic: Yes     Tobacco  smoker: No     Systolic Blood Pressure: 99991111 mmHg     Is BP treated: Yes     HDL Cholesterol: 42 mg/dL     Total Cholesterol: 140 mg/dL  3. Type 2 diabetes mellitus with hyperglycemia, without long-term current use of insulin (HCC) Rhonda Le's diabetes mellitus is not well controlled- A1c elevated at 7.2. She is on 1,000 mg of metformin 2 times daily. She is on at statin. She denies polyphagia.  Lab Results  Component Value Date   HGBA1C 7.2 (H) 11/19/2019   HGBA1C 6.9 (H) 07/13/2019   HGBA1C 6.6 (H) 06/11/2018   Lab Results  Component Value Date   MICROALBUR <0.7 07/13/2019   LDLCALC 69 11/19/2019   CREATININE 0.81 11/19/2019   No results found for: INSULIN  4. At risk for deficient intake of food The patient is at a higher than average risk of deficient intake of food due to lack of appetite and protein.  Assessment/Plan:   1. Vitamin D deficiency Low Vitamin D level contributes to fatigue and are associated with obesity, breast, and colon cancer. Kresha agreed to continue taking prescription Vitamin D 50,000 IU every week and will follow-up for routine testing of Vitamin D, at least 2-3 times per year to avoid over-replacement. We will check labs today.  - VITAMIN D 25 Hydroxy (Vit-D Deficiency, Fractures)  2. Other hyperlipidemia Cardiovascular risk and  specific lipid/LDL goals reviewed. We discussed several lifestyle modifications today and Kaymarie will continue to work on diet, exercise and weight loss efforts. We will check labs today.  - Comprehensive metabolic panel - Lipid Panel With LDL/HDL Ratio  3. Type 2 diabetes mellitus with hyperglycemia, without long-term current use of insulin (HCC) Good blood sugar control is important to decrease the likelihood of diabetic complications such as nephropathy, neuropathy, limb loss, blindness, coronary artery disease, and death. Intensive lifestyle modification including diet, exercise and weight loss are the  first line of treatment for diabetes. Jyl will continue metformin, and we will check labs today.  - Comprehensive metabolic panel - Hemoglobin A1c  4. At risk for deficient intake of food Javae was given approximately 15 minutes of deficit intake of food prevention counseling today. Gari is at risk for eating too few calories based on current food recall. She was encouraged to focus on meeting caloric and protein goals according to her recommended meal plan.   5. Class 2 severe obesity with serious comorbidity and body mass index (BMI) of 38.0 to 38.9 in adult, unspecified obesity type (Rhonda Le) Rhonda Le is currently in the action stage of change. As such, her goal is to continue with weight loss efforts. She has agreed to keeping a food journal and adhering to recommended goals of 1400-1500 calories and 90 grams of protein daily.   Exercise goals: Rhonda Le will start back to normal workout with weight training.  Behavioral modification strategies: increasing lean protein intake, no skipping meals and keeping a strict food journal.  Rhonda Le has agreed to follow-up with our clinic in 2 to 3 weeks. She was informed of the importance of frequent follow-up visits to maximize her success with intensive lifestyle modifications for her multiple health conditions.   Rhonda Le was informed we would discuss her lab results at her next visit unless there is a critical issue that needs to be addressed sooner. Rhonda Le agreed to keep her next visit at the agreed upon time to discuss these results.  Objective:   Blood pressure 111/65, pulse 83, temperature 98.1 F (36.7 C), temperature source Oral, height 5\' 7"  (1.702 m), weight 243 lb (110.2 kg), SpO2 98 %. Body mass index is 38.06 kg/m.  General: Cooperative, alert, well developed, in no acute distress. HEENT: Conjunctivae and lids unremarkable. Cardiovascular: Regular rhythm.  Lungs: Normal work of  breathing. Neurologic: No focal deficits.   Lab Results  Component Value Date   CREATININE 0.81 11/19/2019   BUN 10 11/19/2019   NA 136 11/19/2019   K 4.5 11/19/2019   CL 101 11/19/2019   CO2 21 11/19/2019   Lab Results  Component Value Date   ALT 35 (H) 11/19/2019   AST 40 11/19/2019   ALKPHOS 53 11/19/2019   BILITOT 0.4 11/19/2019   Lab Results  Component Value Date   HGBA1C 7.2 (H) 11/19/2019   HGBA1C 6.9 (H) 07/13/2019   HGBA1C 6.6 (H) 06/11/2018   HGBA1C 6.1 (H) 10/21/2017   No results found for: INSULIN Lab Results  Component Value Date   TSH 2.900 11/19/2019   Lab Results  Component Value Date   CHOL 137 11/19/2019   HDL 43 11/19/2019   LDLCALC 69 11/19/2019   TRIG 147 11/19/2019   CHOLHDL 3.2 11/19/2019   Lab Results  Component Value Date   WBC 6.6 11/19/2019   HGB 12.0 11/19/2019   HCT 37.5 11/19/2019   MCV 90 11/19/2019   PLT 329 11/19/2019   Lab Results  Component Value Date   IRON 67 11/19/2019   TIBC 387 11/19/2019   FERRITIN 33 11/19/2019   Attestation Statements:   Reviewed by clinician on day of visit: allergies, medications, problem list, medical history, surgical history, family history, social history, and previous encounter notes.   Wilhemena Durie, am acting as Location manager for [employer], FNP-C.  I have reviewed the above documentation for accuracy and completeness, and I agree with the above. -  Georgianne Fick, FNP

## 2020-03-04 LAB — COMPREHENSIVE METABOLIC PANEL
ALT: 31 IU/L (ref 0–32)
AST: 29 IU/L (ref 0–40)
Albumin/Globulin Ratio: 1.6 (ref 1.2–2.2)
Albumin: 4.6 g/dL (ref 3.8–4.8)
Alkaline Phosphatase: 57 IU/L (ref 48–121)
BUN/Creatinine Ratio: 15 (ref 9–23)
BUN: 14 mg/dL (ref 6–24)
Bilirubin Total: 0.5 mg/dL (ref 0.0–1.2)
CO2: 19 mmol/L — ABNORMAL LOW (ref 20–29)
Calcium: 9.8 mg/dL (ref 8.7–10.2)
Chloride: 104 mmol/L (ref 96–106)
Creatinine, Ser: 0.93 mg/dL (ref 0.57–1.00)
GFR calc Af Amer: 85 mL/min/{1.73_m2} (ref >59)
GFR calc non Af Amer: 74 mL/min/{1.73_m2} (ref >59)
Globulin, Total: 2.8 g/dL (ref 1.5–4.5)
Glucose: 129 mg/dL — ABNORMAL HIGH (ref 65–99)
Potassium: 4.9 mmol/L (ref 3.5–5.2)
Sodium: 137 mmol/L (ref 134–144)
Total Protein: 7.4 g/dL (ref 6.0–8.5)

## 2020-03-04 LAB — HEMOGLOBIN A1C
Est. average glucose Bld gHb Est-mCnc: 148 mg/dL
Hgb A1c MFr Bld: 6.8 % — ABNORMAL HIGH (ref 4.8–5.6)

## 2020-03-04 LAB — LIPID PANEL WITH LDL/HDL RATIO
Cholesterol, Total: 140 mg/dL (ref 100–199)
HDL: 42 mg/dL (ref >39)
LDL Chol Calc (NIH): 72 mg/dL (ref 0–99)
LDL/HDL Ratio: 1.7 ratio (ref 0.0–3.2)
Triglycerides: 152 mg/dL — ABNORMAL HIGH (ref 0–149)
VLDL Cholesterol Cal: 26 mg/dL (ref 5–40)

## 2020-03-04 LAB — VITAMIN D 25 HYDROXY (VIT D DEFICIENCY, FRACTURES): Vit D, 25-Hydroxy: 43.1 ng/mL (ref 30.0–100.0)

## 2020-03-07 ENCOUNTER — Encounter (INDEPENDENT_AMBULATORY_CARE_PROVIDER_SITE_OTHER): Payer: Self-pay | Admitting: Family Medicine

## 2020-03-07 DIAGNOSIS — E7849 Other hyperlipidemia: Secondary | ICD-10-CM | POA: Insufficient documentation

## 2020-03-20 ENCOUNTER — Other Ambulatory Visit: Payer: Self-pay | Admitting: Family Medicine

## 2020-03-28 ENCOUNTER — Other Ambulatory Visit: Payer: Self-pay | Admitting: Family Medicine

## 2020-03-31 ENCOUNTER — Ambulatory Visit (INDEPENDENT_AMBULATORY_CARE_PROVIDER_SITE_OTHER): Payer: BC Managed Care – PPO | Admitting: Family Medicine

## 2020-03-31 ENCOUNTER — Encounter (INDEPENDENT_AMBULATORY_CARE_PROVIDER_SITE_OTHER): Payer: Self-pay | Admitting: Family Medicine

## 2020-03-31 ENCOUNTER — Other Ambulatory Visit: Payer: Self-pay

## 2020-03-31 VITALS — BP 146/74 | HR 70 | Temp 98.8°F | Ht 67.0 in | Wt 246.0 lb

## 2020-03-31 DIAGNOSIS — E559 Vitamin D deficiency, unspecified: Secondary | ICD-10-CM | POA: Diagnosis not present

## 2020-03-31 DIAGNOSIS — E1169 Type 2 diabetes mellitus with other specified complication: Secondary | ICD-10-CM

## 2020-03-31 DIAGNOSIS — Z6838 Body mass index (BMI) 38.0-38.9, adult: Secondary | ICD-10-CM

## 2020-03-31 MED ORDER — METFORMIN HCL 1000 MG PO TABS: 1000.0000 mg | ORAL_TABLET | Freq: Every day | ORAL | Status: DC

## 2020-04-03 ENCOUNTER — Other Ambulatory Visit: Payer: Self-pay | Admitting: Family Medicine

## 2020-04-04 ENCOUNTER — Encounter (INDEPENDENT_AMBULATORY_CARE_PROVIDER_SITE_OTHER): Payer: Self-pay | Admitting: Family Medicine

## 2020-04-04 NOTE — Progress Notes (Signed)
Chief Complaint:   OBESITY Rhonda Le is here to discuss her progress with her obesity treatment plan along with follow-up of her obesity related diagnoses. Rhonda Le is on keeping a food journal and adhering to recommended goals of 1400-1500 calories and 90 grams of protein daily and states she is following her eating plan approximately 98% of the time. Rhonda Le states she is doing weight training and cardio for 30-45 minutes 5-6 times per week.  Today's visit was #: 8 Starting weight: 245 lbs Starting date: 11/19/2019 Today's weight: 246 lbs Today's date: 03/31/2020 Total lbs lost to date: 0 Total lbs lost since last in-office visit: 0  Interim History: Rhonda Le is quite disappointed with the lack of weight loss. She is journaling consistently and meeting her calorie and protein goals. She notes her hunger is controlled. Her muscle mass has increased from 124.8 to 138.4 lbs. Her fat is down from 113.6 to 100.4 lbs.  Subjective:   1. Vitamin D deficiency Rhonda Le's Vit D level is now up to 43.1. She is on weekly prescription Vit D. I discussed labs with the patient today.  2. Type 2 diabetes mellitus without complication, without long-term current use of insulin (Rhonda Le) Rhonda Le's diabetes mellitus is well controlled. Her A1c has decreased to 6.8 from 7.2. She is on metformin only 1,000 mg daily. I discussed labs with the patient today.  Lab Results  Component Value Date   HGBA1C 6.8 (H) 03/03/2020   HGBA1C 7.2 (H) 11/19/2019   HGBA1C 6.9 (H) 07/13/2019   Lab Results  Component Value Date   MICROALBUR <0.7 07/13/2019   LDLCALC 72 03/03/2020   CREATININE 0.93 03/03/2020   No results found for: INSULIN  Assessment/Plan:   1. Vitamin D deficiency Low Vitamin D level contributes to fatigue and are associated with obesity, breast, and colon cancer. Rhonda Le agreed to continue taking prescription Vitamin D 50,000 IU every week and will follow-up for  routine testing of Vitamin D, at least 2-3 times per year to avoid over-replacement.  2. Type 2 diabetes mellitus without complication, without long-term current use of insulin ([place])  Rhonda Le agreed to continue taking metformin, and will follow up as directed.  3. Class 2 severe obesity due to excess calories with serious comorbidity and body mass index (BMI) of 38.0 to 38.9 in adult [facility]) Rhonda Le is currently in the action stage of change. As such, her goal is to continue with weight loss efforts. She has agreed to keeping a food journal and adhering to recommended goals of 1400-1500 calories and 90 grams of protein daily.   Exercise goals: As is.  Behavioral modification strategies: planning for success and keeping a strict food journal.  Rhonda Le has agreed to follow-up with our clinic in 3 weeks. She was informed of the importance of frequent follow-up visits to maximize her success with intensive lifestyle modifications for her multiple health conditions.   Objective:   Blood pressure (!) 146/74, pulse 70, temperature 98.8 F (37.1 C), temperature source Oral, height 5\' 7"  (1.702 m), weight 246 lb (111.6 kg), SpO2 99 %. Body mass index is 38.53 kg/m.  General: Cooperative, alert, well developed, in no acute distress. HEENT: Conjunctivae and lids unremarkable. Cardiovascular: Regular rhythm.  Lungs: Normal work of breathing. Neurologic: No focal deficits.   Lab Results  Component Value Date   CREATININE 0.93 03/03/2020   BUN 14 03/03/2020   NA 137 03/03/2020   K 4.9 03/03/2020   CL 104 03/03/2020   CO2 19 (L) 03/03/2020  Lab Results  Component Value Date   ALT 31 03/03/2020   AST 29 03/03/2020   ALKPHOS 57 03/03/2020   BILITOT 0.5 03/03/2020   Lab Results  Component Value Date   HGBA1C 6.8 (H) 03/03/2020   HGBA1C 7.2 (H) 11/19/2019   HGBA1C 6.9 (H) 07/13/2019   HGBA1C 6.6 (H) 06/11/2018   HGBA1C 6.1 (H) 10/21/2017   No results found for:  INSULIN Lab Results  Component Value Date   TSH 2.900 11/19/2019   Lab Results  Component Value Date   CHOL 140 03/03/2020   HDL 42 03/03/2020   LDLCALC 72 03/03/2020   TRIG 152 (H) 03/03/2020   CHOLHDL 3.2 11/19/2019   Lab Results  Component Value Date   WBC 6.6 11/19/2019   HGB 12.0 11/19/2019   HCT 37.5 11/19/2019   MCV 90 11/19/2019   PLT 329 11/19/2019   Lab Results  Component Value Date   IRON 67 11/19/2019   TIBC 387 11/19/2019   FERRITIN 33 11/19/2019   Attestation Statements:   Reviewed by clinician on day of visit: allergies, medications, problem list, medical history, surgical history, family history, social history, and previous encounter notes.   Wilhemena Durie, am acting as Location manager for [employer], FNP-C.  I have reviewed the above documentation for accuracy and completeness, and I agree with the above. -  Georgianne Fick, FNP

## 2020-04-26 ENCOUNTER — Other Ambulatory Visit: Payer: Self-pay

## 2020-04-26 ENCOUNTER — Encounter (INDEPENDENT_AMBULATORY_CARE_PROVIDER_SITE_OTHER): Payer: Self-pay | Admitting: Family Medicine

## 2020-04-26 ENCOUNTER — Ambulatory Visit (INDEPENDENT_AMBULATORY_CARE_PROVIDER_SITE_OTHER): Payer: BC Managed Care – PPO | Admitting: Family Medicine

## 2020-04-26 VITALS — BP 126/74 | HR 70 | Temp 98.2°F | Ht 67.0 in | Wt 247.0 lb

## 2020-04-26 DIAGNOSIS — Z6838 Body mass index (BMI) 38.0-38.9, adult: Secondary | ICD-10-CM

## 2020-04-26 DIAGNOSIS — K9049 Malabsorption due to intolerance, not elsewhere classified: Secondary | ICD-10-CM | POA: Diagnosis not present

## 2020-04-26 DIAGNOSIS — E1169 Type 2 diabetes mellitus with other specified complication: Secondary | ICD-10-CM

## 2020-04-27 ENCOUNTER — Encounter: Payer: Self-pay | Admitting: Nurse Practitioner

## 2020-04-27 ENCOUNTER — Ambulatory Visit (INDEPENDENT_AMBULATORY_CARE_PROVIDER_SITE_OTHER): Payer: BC Managed Care – PPO | Admitting: Nurse Practitioner

## 2020-04-27 VITALS — BP 124/82 | Ht 67.25 in | Wt 249.0 lb

## 2020-04-27 DIAGNOSIS — Z01419 Encounter for gynecological examination (general) (routine) without abnormal findings: Secondary | ICD-10-CM

## 2020-04-27 DIAGNOSIS — N921 Excessive and frequent menstruation with irregular cycle: Secondary | ICD-10-CM

## 2020-04-27 DIAGNOSIS — N951 Menopausal and female climacteric states: Secondary | ICD-10-CM

## 2020-04-27 NOTE — Progress Notes (Signed)
   Rhonda Le [birth year] [id number]   History:  44 y.o. [id number] presents to establish care. Complains of irregular cycles with heavy bleeding that requires super tampons plus pads with changes every 30-60 minutes the first couple of days. This change began about a year ago. Cycles range from 2 weeks to 2 months. She also complains of hot flashes, insomnia, night sweats, and mood swings. She does not want to be on medication for vasomotor symptoms. 2003 BTL, vasectomy. Breast reduction 2004. History of Vitamin D deficiency, HTN, DM, TIA (2019) managed by primary care. Currently in a weight loss program but is not having much success.   Gynecologic History Patient's last menstrual period was 04/07/2020 (approximate). Period Cycle (Days):  (sporadic) Period Duration (Days): 3-7 Period Pattern: (!) Irregular Menstrual Flow: Heavy Menstrual Control: Maxi pad, Tampon Menstrual Control Change Freq (Hours): heaviest day 30-49mins Contraception: none Last Pap: 3 years ago per patient . Results were: normal Last mammogram: 07/27/2019. Results were: normal   Past medical history, past surgical history, family history and social history were all reviewed and documented in the EPIC chart.  ROS:  A ROS was performed and pertinent positives and negatives are included.  Exam:  Vitals:   04/27/20 1517  BP: 124/82  Weight: 249 lb (112.9 kg)  Height: 5' 7.25" (1.708 m)   Body mass index is 38.71 kg/m.  General appearance:  Normal Thyroid:  Symmetrical, normal in size, without palpable masses or nodularity. Respiratory  Auscultation:  Clear without wheezing or rhonchi Cardiovascular  Auscultation:  Regular rate, without rubs, murmurs or gallops  Edema/varicosities:  Not grossly evident Abdominal  Soft,nontender, without masses, guarding or rebound.  Liver/spleen:  No organomegaly noted  Hernia:  None appreciated  Skin  Inspection:  Grossly normal   Breasts: Examined lying and  sitting. Breast reduction scars  Right: Without masses, retractions, discharge or axillary adenopathy.   Left: Without masses, retractions, discharge or axillary adenopathy. Gentitourinary   Inguinal/mons:  Normal without inguinal adenopathy  External genitalia:  Normal  BUS/Urethra/Skene's glands:  Normal  Vagina:  Normal  Cervix:  Normal  Uterus:  Anteverted, normal in size, shape and contour.  Midline and mobile  Adnexa/parametria:     Rt: Without masses or tenderness.   Lt: Without masses or tenderness.  Anus and perineum: Normal  Assessment/Plan:  44 y.o. [id number] to establish care.     Well female exam with routine gynecological exam - Education provided on SBEs, importance of preventative screenings, current guidelines, high calcium diet, regular exercise, and multivitamin daily.   Menorrhagia with irregular cycle - Plan: US PELVIS TRANSVAGINAL NON-OB (TV ONLY). Ultrasound to rule out fibroids/polyps. We discussed pharmacological management with hormonal contraception if normal ultrasound. She wants to avoid medications if possible.  Menopausal symptoms - lifestyle changes to include fans, sleeping in less clothing, avoiding caffeine/alcohol/nicotine/spicy foods that may exacerbate hot flashes. OTC vitamin E or black cohosh.   Follow up in 1 year for annual       [place], 3:35 PM 04/27/2020

## 2020-04-27 NOTE — Patient Instructions (Signed)
Health Maintenance, Female Adopting a healthy lifestyle and getting preventive care are important in promoting health and wellness. Ask your health care provider about:  The right schedule for you to have regular tests and exams.  Things you can do on your own to prevent diseases and keep yourself healthy. What should I know about diet, weight, and exercise? Eat a healthy diet   Eat a diet that includes plenty of vegetables, fruits, low-fat dairy products, and lean protein.  Do not eat a lot of foods that are high in solid fats, added sugars, or sodium. Maintain a healthy weight Body mass index (BMI) is used to identify weight problems. It estimates body fat based on height and weight. Your health care provider can help determine your BMI and help you achieve or maintain a healthy weight. Get regular exercise Get regular exercise. This is one of the most important things you can do for your health. Most adults should:  Exercise for at least 150 minutes each week. The exercise should increase your heart rate and make you sweat (moderate-intensity exercise).  Do strengthening exercises at least twice a week. This is in addition to the moderate-intensity exercise.  Spend less time sitting. Even light physical activity can be beneficial. Watch cholesterol and blood lipids Have your blood tested for lipids and cholesterol at 46 years of age, then have this test every 5 years. Have your cholesterol levels checked more often if:  Your lipid or cholesterol levels are high.  You are older than 46 years of age.  You are at high risk for heart disease. What should I know about cancer screening? Depending on your health history and family history, you may need to have cancer screening at various ages. This may include screening for:  Breast cancer.  Cervical cancer.  Colorectal cancer.  Skin cancer.  Lung cancer. What should I know about heart disease, diabetes, and high blood  pressure? Blood pressure and heart disease  High blood pressure causes heart disease and increases the risk of stroke. This is more likely to develop in people who have high blood pressure readings, are of African descent, or are overweight.  Have your blood pressure checked: ? Every 3-5 years if you are 18-39 years of age. ? Every year if you are 40 years old or older. Diabetes Have regular diabetes screenings. This checks your fasting blood sugar level. Have the screening done:  Once every three years after age 40 if you are at a normal weight and have a low risk for diabetes.  More often and at a younger age if you are overweight or have a high risk for diabetes. What should I know about preventing infection? Hepatitis B If you have a higher risk for hepatitis B, you should be screened for this virus. Talk with your health care provider to find out if you are at risk for hepatitis B infection. Hepatitis C Testing is recommended for:  Everyone born from 1945 through 1965.  Anyone with known risk factors for hepatitis C. Sexually transmitted infections (STIs)  Get screened for STIs, including gonorrhea and chlamydia, if: ? You are sexually active and are younger than 46 years of age. ? You are older than 46 years of age and your health care provider tells you that you are at risk for this type of infection. ? Your sexual activity has changed since you were last screened, and you are at increased risk for chlamydia or gonorrhea. Ask your health care provider if   you are at risk.  Ask your health care provider about whether you are at high risk for HIV. Your health care provider may recommend a prescription medicine to help prevent HIV infection. If you choose to take medicine to prevent HIV, you should first get tested for HIV. You should then be tested every 3 months for as long as you are taking the medicine. Pregnancy  If you are about to stop having your period (premenopausal) and  you may become pregnant, seek counseling before you get pregnant.  Take 400 to 800 micrograms (mcg) of folic acid every day if you become pregnant.  Ask for birth control (contraception) if you want to prevent pregnancy. Osteoporosis and menopause Osteoporosis is a disease in which the bones lose minerals and strength with aging. This can result in bone fractures. If you are 65 years old or older, or if you are at risk for osteoporosis and fractures, ask your health care provider if you should:  Be screened for bone loss.  Take a calcium or vitamin D supplement to lower your risk of fractures.  Be given hormone replacement therapy (HRT) to treat symptoms of menopause. Follow these instructions at home: Lifestyle  Do not use any products that contain nicotine or tobacco, such as cigarettes, e-cigarettes, and chewing tobacco. If you need help quitting, ask your health care provider.  Do not use street drugs.  Do not share needles.  Ask your health care provider for help if you need support or information about quitting drugs. Alcohol use  Do not drink alcohol if: ? Your health care provider tells you not to drink. ? You are pregnant, may be pregnant, or are planning to become pregnant.  If you drink alcohol: ? Limit how much you use to 0-1 drink a day. ? Limit intake if you are breastfeeding.  Be aware of how much alcohol is in your drink. In the [place] one drink equals one 12 oz bottle of beer (355 mL), one 5 oz glass of wine (148 mL), or one 1 oz glass of hard liquor (44 mL). General instructions  Schedule regular health, dental, and eye exams.  Stay current with your vaccines.  Tell your health care provider if: ? You often feel depressed. ? You have ever been abused or do not feel safe at home. Summary  Adopting a healthy lifestyle and getting preventive care are important in promoting health and wellness.  Follow your health care provider's instructions about healthy  diet, exercising, and getting tested or screened for diseases.  Follow your health care provider's instructions on monitoring your cholesterol and blood pressure. This information is not intended to replace advice given to you by your health care provider. Make sure you discuss any questions you have with your health care provider. Document Revised: 09/24/2018 Document Reviewed: 09/24/2018 Elsevier Patient Education  2020 [employer] Inc[employer]  

## 2020-04-28 NOTE — Progress Notes (Signed)
Chief Complaint:   OBESITY Rhonda Le is here to discuss her progress with her obesity treatment plan along with follow-up of her obesity related diagnoses. Rhonda Le is on keeping a food journal and adhering to recommended goals of 1400-1500 calories and 90 grams of protein daily and states she is following her eating plan approximately 95% of the time. Rhonda Le states she is walking land weights for 30-45 minutes 6 times per week.  Today's visit was #: 9 Starting weight: 245 lbs Starting date: 11/19/2019 Today's weight: 247 lbs Today's date: 04/26/2020 Total lbs lost to date: 0 Total lbs lost since last in-office visit: 0  Interim History: Rhonda Le is very frustrated with lack of weight loss though bioimpedance scale shows increased muscle mass and decreased fat % since starting our program.  She is doing exercises including cardio and weight lifting 6 days per week. She is journaling consistently and accurately.  She wonders if lack of weight loss could be related to hormonal issues.   Subjective:   1. Type 2 diabetes mellitus with other specified complication, without long-term current use of insulin (HCC) Rhonda Le's diabetes mellitus is well controlled with metformin.  Lab Results  Component Value Date   HGBA1C 6.8 (H) 03/03/2020   HGBA1C 7.2 (H) 11/19/2019   HGBA1C 6.9 (H) 07/13/2019   Lab Results  Component Value Date   MICROALBUR <0.7 07/13/2019   LDLCALC 72 03/03/2020   CREATININE 0.93 03/03/2020   No results found for: INSULIN  2. Food intolerance Rhonda Le feels she may have some food allergies. She has noticed she does not digest beef well. She wonders if this could be impeding weight loss.  Assessment/Plan:   1. Type 2 diabetes mellitus with other specified complication, without long-term current use of insulin (HCC) Good blood sugar control is important to decrease the likelihood of diabetic complications such as nephropathy, neuropathy,  limb loss, blindness, coronary artery disease, and death. Intensive lifestyle modification including diet, exercise and weight loss are the first line of treatment for diabetes. Rhonda Le will continue metformin.   2. Food intolerance Rhonda Le is to discuss a referral to an allergist with her primary care physician.   3. Class 2 severe obesity due to excess calories with serious comorbidity and body mass index (BMI) of 38.0 to 38.9 in adult [facility]) Mellina is currently in the action stage of change. As such, her goal is to continue with weight loss efforts. She has agreed to keeping a food journal and adhering to recommended goals of 1200-1300 calories and 90-100 grams of protein daily or following a lower carbohydrate, vegetable and lean protein rich diet plan.   I will consult with Dr. Juleen China regarding her lack of weight loss. She will discuss weight issues with gynecology at her upcoming appointment.   Exercise goals: As is, increase cardio and resistance  Behavioral modification strategies: decreasing simple carbohydrates.  Rhonda Le has agreed to follow-up with our clinic in 3 weeks. She was informed of the importance of frequent follow-up visits to maximize her success with intensive lifestyle modifications for her multiple health conditions.   Objective:   Blood pressure 126/74, pulse 70, temperature 98.2 F (36.8 C), temperature source Oral, height 5\' 7"  (1.702 m), weight 247 lb (112 kg), last menstrual period 04/07/2020, SpO2 98 %. Body mass index is 38.69 kg/m.  General: Cooperative, alert, well developed, in no acute distress. HEENT: Conjunctivae and lids unremarkable. Cardiovascular: Regular rhythm.  Lungs: Normal work of breathing. Neurologic: No focal deficits.   Lab  Results  Component Value Date   CREATININE 0.93 03/03/2020   BUN 14 03/03/2020   NA 137 03/03/2020   K 4.9 03/03/2020   CL 104 03/03/2020   CO2 19 (L) 03/03/2020   Lab Results  Component  Value Date   ALT 31 03/03/2020   AST 29 03/03/2020   ALKPHOS 57 03/03/2020   BILITOT 0.5 03/03/2020   Lab Results  Component Value Date   HGBA1C 6.8 (H) 03/03/2020   HGBA1C 7.2 (H) 11/19/2019   HGBA1C 6.9 (H) 07/13/2019   HGBA1C 6.6 (H) 06/11/2018   HGBA1C 6.1 (H) 10/21/2017   No results found for: INSULIN Lab Results  Component Value Date   TSH 2.900 11/19/2019   Lab Results  Component Value Date   CHOL 140 03/03/2020   HDL 42 03/03/2020   LDLCALC 72 03/03/2020   TRIG 152 (H) 03/03/2020   CHOLHDL 3.2 11/19/2019   Lab Results  Component Value Date   WBC 6.6 11/19/2019   HGB 12.0 11/19/2019   HCT 37.5 11/19/2019   MCV 90 11/19/2019   PLT 329 11/19/2019   Lab Results  Component Value Date   IRON 67 11/19/2019   TIBC 387 11/19/2019   FERRITIN 33 11/19/2019   Attestation Statements:   Reviewed by clinician on day of visit: allergies, medications, problem list, medical history, surgical history, family history, social history, and previous encounter notes.   Wilhemena Durie, am acting as Location manager for [employer], FNP-C.  I have reviewed the above documentation for accuracy and completeness, and I agree with the above. -  Georgianne Fick, FNP

## 2020-04-29 ENCOUNTER — Encounter (INDEPENDENT_AMBULATORY_CARE_PROVIDER_SITE_OTHER): Payer: Self-pay | Admitting: Family Medicine

## 2020-05-12 ENCOUNTER — Ambulatory Visit (INDEPENDENT_AMBULATORY_CARE_PROVIDER_SITE_OTHER): Payer: BC Managed Care – PPO | Admitting: Nurse Practitioner

## 2020-05-12 ENCOUNTER — Encounter: Payer: Self-pay | Admitting: Nurse Practitioner

## 2020-05-12 ENCOUNTER — Other Ambulatory Visit: Payer: Self-pay

## 2020-05-12 ENCOUNTER — Ambulatory Visit (INDEPENDENT_AMBULATORY_CARE_PROVIDER_SITE_OTHER): Payer: BC Managed Care – PPO

## 2020-05-12 VITALS — BP 124/80

## 2020-05-12 DIAGNOSIS — D259 Leiomyoma of uterus, unspecified: Secondary | ICD-10-CM | POA: Diagnosis not present

## 2020-05-12 DIAGNOSIS — N854 Malposition of uterus: Secondary | ICD-10-CM

## 2020-05-12 DIAGNOSIS — N951 Menopausal and female climacteric states: Secondary | ICD-10-CM | POA: Diagnosis not present

## 2020-05-12 DIAGNOSIS — N921 Excessive and frequent menstruation with irregular cycle: Secondary | ICD-10-CM

## 2020-05-12 DIAGNOSIS — N852 Hypertrophy of uterus: Secondary | ICD-10-CM

## 2020-05-12 NOTE — Patient Instructions (Addendum)
Uterine Fibroids  Uterine fibroids (leiomyomas) are noncancerous (benign) tumors that can develop in the uterus. Fibroids may also develop in the fallopian tubes, cervix, or tissues (ligaments) near the uterus. You may have one or many fibroids. Fibroids vary in size, weight, and where they grow in the uterus. Some can become quite large. Most fibroids do not require medical treatment. What are the causes? The cause of this condition is not known. What increases the risk? You are more likely to develop this condition if you:  Are in your 30s or 40s and have not gone through menopause.  Have a family history of this condition.  Are of African-American descent.  Had your first period at an early age (early menarche).  Have not had any children (nulliparity).  Are overweight or obese. What are the signs or symptoms? Many women do not have any symptoms. Symptoms of this condition may include:  Heavy menstrual bleeding.  Bleeding or spotting between periods.  Pain and pressure in the pelvic area, between the hips.  Bladder problems, such as needing to urinate urgently or more often than usual.  Inability to have children (infertility).  Failure to carry pregnancy to term (miscarriage). How is this diagnosed? This condition may be diagnosed based on:  Your symptoms and medical history.  A physical exam.  A pelvic exam that includes feeling for any tumors.  Imaging tests, such as ultrasound or MRI. How is this treated? Treatment for this condition may include:  Seeing your health care provider for follow-up visits to monitor your fibroids for any changes.  Taking NSAIDs such as ibuprofen, naproxen, or aspirin to reduce pain.  Hormone medicines. These may be taken as a pill, given in an injection, or delivered by a T-shaped device that is inserted into the uterus (intrauterine device, IUD).  Surgery to remove one of the following: ? The fibroids (myomectomy). Your health  care provider may recommend this if fibroids affect your fertility and you want to become pregnant. ? The uterus (hysterectomy). ? Blood supply to the fibroids (uterine artery embolization). Follow these instructions at home:  Take over-the-counter and prescription medicines only as told by your health care provider.  Ask your health care provider if you should take iron pills or eat more iron-rich foods, such as dark green, leafy vegetables. Heavy menstrual bleeding can cause low iron levels.  If directed, apply heat to your back or abdomen to reduce pain. Use the heat source that your health care provider recommends, such as a moist heat pack or a heating pad. ? Place a towel between your skin and the heat source. ? Leave the heat on for 20-30 minutes. ? Remove the heat if your skin turns bright red. This is especially important if you are unable to feel pain, heat, or cold. You may have a greater risk of getting burned.  Pay close attention to your menstrual cycle. Tell your health care provider about any changes, such as: ? Increased blood flow that requires you to use more pads or tampons than usual. ? A change in the number of days that your period lasts. ? A change in symptoms that are associated with your period, such as back pain or cramps in your abdomen.  Keep all follow-up visits as told by your health care provider. This is important, especially if your fibroids need to be monitored for any changes. Contact a health care provider if you:  Have pelvic pain, back pain, or cramps in your abdomen that   do not get better with medicine or heat.  Develop new bleeding between periods.  Have increased bleeding during or between periods.  Feel unusually tired or weak.  Feel light-headed. Get help right away if you:  Faint.  Have pelvic pain that suddenly gets worse.  Have severe vaginal bleeding that soaks a tampon or pad in 30 minutes or less. Summary  Uterine fibroids are  noncancerous (benign) tumors that can develop in the uterus.  The exact cause of this condition is not known.  Most fibroids do not require medical treatment unless they affect your ability to have children (fertility).  Contact a health care provider if you have pelvic pain, back pain, or cramps in your abdomen that do not get better with medicines.  Make sure you know what symptoms should cause you to get help right away. This information is not intended to replace advice given to you by your health care provider. Make sure you discuss any questions you have with your health care provider. Document Revised: 09/13/2017 Document Reviewed: 08/27/2017 Elsevier Patient Education  2020 [place] is the normal time of life before and after menstrual periods stop completely (menopause). Perimenopause can begin 2-8 years before menopause, and it usually lasts for 1 year after menopause. During perimenopause, the ovaries may or may not produce an egg. What are the causes? This condition is caused by a natural change in hormone levels that happens as you get older. What increases the risk? This condition is more likely to start at an earlier age if you have certain medical conditions or treatments, including:  A tumor of the pituitary gland in the brain.  A disease that affects the ovaries and hormone production.  Radiation treatment for cancer.  Certain cancer treatments, such as chemotherapy or hormone (anti-estrogen) therapy.  Heavy smoking and excessive alcohol use.  Family history of early menopause. What are the signs or symptoms? Perimenopausal changes affect each woman differently. Symptoms of this condition may include:  Hot flashes.  Night sweats.  Irregular menstrual periods.  Decreased sex drive.  Vaginal dryness.  Headaches.  Mood swings.  Depression.  Memory problems or trouble  concentrating.  Irritability.  Tiredness.  Weight gain.  Anxiety.  Trouble getting pregnant. How is this diagnosed? This condition is diagnosed based on your medical history, a physical exam, your age, your menstrual history, and your symptoms. Hormone tests may also be done. How is this treated? In some cases, no treatment is needed. You and your health care provider should make a decision together about whether treatment is necessary. Treatment will be based on your individual condition and preferences. Various treatments are available, such as:  Menopausal hormone therapy (MHT).  Medicines to treat specific symptoms.  Acupuncture.  Vitamin or herbal supplements. Before starting treatment, make sure to let your health care provider know if you have a personal or family history of:  Heart disease.  Breast cancer.  Blood clots.  Diabetes.  Osteoporosis. Follow these instructions at home: Lifestyle  Do not use any products that contain nicotine or tobacco, such as cigarettes and e-cigarettes. If you need help quitting, ask your health care provider.  Eat a balanced diet that includes fresh fruits and vegetables, whole grains, soybeans, eggs, lean meat, and low-fat dairy.  Get at least 30 minutes of physical activity on 5 or more days each week.  Avoid alcoholic and caffeinated beverages, as well as spicy foods. This may help prevent hot flashes.  Get 7-8 hours of sleep each night.  Dress in layers that can be removed to help you manage hot flashes.  Find ways to manage stress, such as deep breathing, meditation, or journaling. General instructions  Keep track of your menstrual periods, including: ? When they occur. ? How heavy they are and how long they last. ? How much time passes between periods.  Keep track of your symptoms, noting when they start, how often you have them, and how long they last.  Take over-the-counter and prescription medicines only as  told by your health care provider.  Take vitamin supplements only as told by your health care provider. These may include calcium, vitamin E, and vitamin D.  Use vaginal lubricants or moisturizers to help with vaginal dryness and improve comfort during sex.  Talk with your health care provider before starting any herbal supplements.  Keep all follow-up visits as told by your health care provider. This is important. This includes any group therapy or counseling. Contact a health care provider if:  You have heavy vaginal bleeding or pass blood clots.  Your period lasts more than 2 days longer than normal.  Your periods are recurring sooner than 21 days.  You bleed after having sex. Get help right away if:  You have chest pain, trouble breathing, or trouble talking.  You have severe depression.  You have pain when you urinate.  You have severe headaches.  You have vision problems. Summary  Perimenopause is the time when a woman's body begins to move into menopause. This may happen naturally or as a result of other health problems or medical treatments.  Perimenopause can begin 2-8 years before menopause, and it usually lasts for 1 year after menopause.  Perimenopausal symptoms can be managed through medicines, lifestyle changes, and complementary therapies such as acupuncture. This information is not intended to replace advice given to you by your health care provider. Make sure you discuss any questions you have with your health care provider. Document Revised: 09/13/2017 Document Reviewed: 11/06/2016 Elsevier Patient Education  2020 [employer].

## 2020-05-12 NOTE — Progress Notes (Signed)
History: 44 year old [id number] presents to discuss ultrasound.  She was seen by me 04/27/2020 with complaints of irregular cycles that began about a year ago with heavy bleeding that requires super tampons plus pads with changes every 30 to 60 minutes.  Cycles range from 2 weeks to 2 months.  She is experiencing some menopausal symptoms -hot flashes, insomnia, night sweats, and mood swings. We discussed lifestyle changes such as using fans, sleeping in less clothing, and avoiding caffeine/alcohol/nicotine/spicy foods that may exacerbate hot flashes.  We also discussed OTC vitamin E, Black Cohosh, or Ginseng, Effexor, and hormonal therapy if these are not effective and symptoms are severe.   Exam: Ultrasound: Anteverted, slightly enlarged uterus 10 x 7 x 6 cm.  3 small fibroids, largest 2.5 cm subserous posteriorly, none are adjacent to endometrium.  Symmetrical secretory endometrium 8.8 mm, no masses or thickening seen.  Both ovaries normal size with normal follicle pattern.  2 simple follicles on the left side 29 x 15 mm and 17 x 17 mm.  No adnexal masses, no free fluid.  Assessment: Leiomyoma of body of uterus #3 Menorrhagia with irregular cycle Perimenopause Vasomotor symptoms  Plan: Reviewed ultrasound and reassurance provided on findings. 3 small uterine fibroids seen and most likely not cause of heavy, irregular bleeding. She is most likely perimenopausal based on cycle changes and vasomotor symptoms. We again discussed treatment options to include lifestyle changes for managing hot flashes/night sweats, OTC herbal supplements and Vitamin E, Effexor, and progesterone-only contraception for managing bleeding. She will try OTC recommendations and lifestyle changes. She is interested in the IUD after discussion and will think on this. Written education provided on Mirena. We will check coverage and if she decides to proceed she will call to schedule IUD insertion with Dr. Delilah Shan.

## 2020-05-17 ENCOUNTER — Ambulatory Visit (INDEPENDENT_AMBULATORY_CARE_PROVIDER_SITE_OTHER): Payer: BC Managed Care – PPO | Admitting: Family Medicine

## 2020-05-17 ENCOUNTER — Other Ambulatory Visit: Payer: Self-pay

## 2020-05-17 ENCOUNTER — Encounter (INDEPENDENT_AMBULATORY_CARE_PROVIDER_SITE_OTHER): Payer: Self-pay | Admitting: Family Medicine

## 2020-05-17 VITALS — BP 125/67 | HR 81 | Temp 98.3°F | Ht 67.0 in | Wt 246.0 lb

## 2020-05-17 DIAGNOSIS — E1169 Type 2 diabetes mellitus with other specified complication: Secondary | ICD-10-CM

## 2020-05-17 DIAGNOSIS — Z6838 Body mass index (BMI) 38.0-38.9, adult: Secondary | ICD-10-CM

## 2020-05-17 NOTE — Progress Notes (Signed)
Chief Complaint:   OBESITY Rhonda Le is here to discuss her progress with her obesity treatment plan along with follow-up of her obesity related diagnoses. Rhonda Le is on keeping a food journal and adhering to recommended goals of 1200-1300 calories and 90-100 grams of protein daily or following a lower carbohydrate, vegetable and lean protein rich diet plan and states she is following her eating plan approximately 85-90% of the time. Rhonda Le states she is walking for 30-45 minutes 3-4 times per week.  Today's visit was #: 10 Starting weight: 245 lbs Starting date: 11/19/2019 Today's weight: 246 lbs Today's date: 05/17/2020 Total lbs lost to date: 0 Total lbs lost since last in-office visit: 1  Interim History: Rhonda Le was doing the Low carbohydrate plan and she notes she has been in ketosis over the past few weeks which is concerning to her. She then switched back to journaling and she has been consistent with journaling. A review of her journal shows too few calories consumed and not enough protein.  She is frustrated with slow weight loss and notes she does feel quite anxious when coming for her visits. may want to take a break from the program.  Subjective:   1. Type 2 diabetes mellitus with other specified complication, without long-term current use of insulin (HCC) Rhonda Le's diabetes mellitus is well controlled on metformin.  Lab Results  Component Value Date   HGBA1C 6.8 (H) 03/03/2020   HGBA1C 7.2 (H) 11/19/2019   HGBA1C 6.9 (H) 07/13/2019   Lab Results  Component Value Date   MICROALBUR <0.7 07/13/2019   LDLCALC 72 03/03/2020   CREATININE 0.93 03/03/2020   No results found for: INSULIN  Assessment/Plan:   1. Type 2 diabetes mellitus with other specified complication, without long-term current use of insulin ([place]) .Rhonda Le will continue metformin, and will follow up as directed.  2. Class 2 severe obesity due to excess calories with  serious comorbidity and body mass index (BMI) of 38.0 to 38.9 in adult [facility]) Rhonda Le is currently in the action stage of change. As such, her goal is to continue with weight loss efforts. She has agreed to keeping a food journal and adhering to recommended goals of 1200-1300 calories and 80 grams of protein daily.   Exercise goals: As is.  Behavioral modification strategies: increasing lean protein intake, no skipping meals and keeping a strict food journal.  Rhonda Le has agreed to follow-up with our clinic in 12 weeks, as she has requested hiatus from the program. She was informed of the importance of frequent follow-up visits to maximize her success with intensive lifestyle modifications for her multiple health conditions.   Objective:   Blood pressure 125/67, pulse 81, temperature 98.3 F (36.8 C), temperature source Oral, height 5\' 7"  (1.702 m), weight 246 lb (111.6 kg), SpO2 99 %. Body mass index is 38.53 kg/m.  General: Cooperative, alert, well developed, in no acute distress. HEENT: Conjunctivae and lids unremarkable. Cardiovascular: Regular rhythm.  Lungs: Normal work of breathing. Neurologic: No focal deficits.   Lab Results  Component Value Date   CREATININE 0.93 03/03/2020   BUN 14 03/03/2020   NA 137 03/03/2020   K 4.9 03/03/2020   CL 104 03/03/2020   CO2 19 (L) 03/03/2020   Lab Results  Component Value Date   ALT 31 03/03/2020   AST 29 03/03/2020   ALKPHOS 57 03/03/2020   BILITOT 0.5 03/03/2020   Lab Results  Component Value Date   HGBA1C 6.8 (H) 03/03/2020   HGBA1C  7.2 (H) 11/19/2019   HGBA1C 6.9 (H) 07/13/2019   HGBA1C 6.6 (H) 06/11/2018   HGBA1C 6.1 (H) 10/21/2017   No results found for: INSULIN Lab Results  Component Value Date   TSH 2.900 11/19/2019   Lab Results  Component Value Date   CHOL 140 03/03/2020   HDL 42 03/03/2020   LDLCALC 72 03/03/2020   TRIG 152 (H) 03/03/2020   CHOLHDL 3.2 11/19/2019   Lab Results  Component Value  Date   WBC 6.6 11/19/2019   HGB 12.0 11/19/2019   HCT 37.5 11/19/2019   MCV 90 11/19/2019   PLT 329 11/19/2019   Lab Results  Component Value Date   IRON 67 11/19/2019   TIBC 387 11/19/2019   FERRITIN 33 11/19/2019   Attestation Statements:   Reviewed by clinician on day of visit: allergies, medications, problem list, medical history, surgical history, family history, social history, and previous encounter notes.   Wilhemena Durie, am acting as Location manager for [employer], FNP-C.  I have reviewed the above documentation for accuracy and completeness, and I agree with the above. -  Georgianne Fick, FNP

## 2020-06-13 ENCOUNTER — Other Ambulatory Visit: Payer: Self-pay

## 2020-06-13 ENCOUNTER — Ambulatory Visit: Payer: BC Managed Care – PPO | Admitting: Family Medicine

## 2020-06-13 ENCOUNTER — Encounter: Payer: Self-pay | Admitting: Family Medicine

## 2020-06-13 VITALS — BP 148/72 | HR 83 | Temp 98.4°F | Ht 67.0 in | Wt 249.4 lb

## 2020-06-13 DIAGNOSIS — R1011 Right upper quadrant pain: Secondary | ICD-10-CM

## 2020-06-13 MED ORDER — BENZONATATE 100 MG PO CAPS: 100.0000 mg | ORAL_CAPSULE | Freq: Three times a day (TID) | ORAL | 0 refills | Status: DC | PRN

## 2020-06-13 MED ORDER — DICYCLOMINE HCL 10 MG PO CAPS: ORAL_CAPSULE | ORAL | 0 refills | Status: DC

## 2020-06-13 NOTE — Addendum Note (Signed)
Addended by: Ames Coupe on: 06/13/2020 10:30 AM   Modules accepted: Orders

## 2020-06-13 NOTE — Progress Notes (Addendum)
Chief Complaint  Patient presents with  . Abdominal Pain    Rhonda Le is here for R sided abdominal pain.  Duration: 1 mo ; worse 1 week ago Happens daily.  Describes as a hot and dull. Sometimes cramping.  Nighttime awakenings? Yes Bleeding? No Weight loss? No Palliation: massage, moving/stretching Provocation: Eating Associated symptoms: nausea  Denies: fever, vomiting and bowel changes Treatment to date: Mylanta  Past Medical History:  Diagnosis Date  . Anemia   . Chest pain   . Constipation   . Diabetes mellitus without complication ([place])   . GERD (gastroesophageal reflux disease)   . High cholesterol   . Hypertension   . Morbid obesity ([place])   . Stroke ([place])     BP (!) 148/72 (BP Location: Left Arm, Patient Position: Sitting, Cuff Size: Large)   Pulse 83   Temp 98.4 F (36.9 C) (Oral)   Ht 5\' 7"  (1.702 m)   Wt 249 lb 6 oz (113.1 kg)   SpO2 99%   BMI 39.06 kg/m  Gen.: Awake, alert, appears stated age Heart: Regular rate and rhythm Lungs: Clear auscultation bilaterally, no rales or wheezing, normal effort without accessory muscle use. Abdomen: Bowel sounds are present. Abdomen is soft, ttp in RUQ, nondistended, no masses or organomegaly. +Murphy's, neg Rovsing's, McBurney's, and Carnett's sign. Psych: Age appropriate judgment and insight. Normal mood and affect.  Right upper quadrant abdominal pain - Plan: US Abdomen Limited RUQ, dicyclomine (BENTYL) 10 MG capsule  If neg, will refer to GI given her nighttime awakenings. If suggestive of gallbladder dz, will refer to gen surg.  BP noted. Pt had pain while in office.  F/u as originally scheduled. Pt voiced understanding and agreement to the plan.  [place], DO 06/13/20 10:30 AM

## 2020-06-13 NOTE — Patient Instructions (Signed)
Try to avoid aggravating foods. I think avoiding greasy/fatty foods is in your best interest until the ultrasound comes back.  We will be in touch regarding your ultrasound as that will dictate our next steps.  Let us know if you need anything.

## 2020-06-14 ENCOUNTER — Ambulatory Visit (HOSPITAL_BASED_OUTPATIENT_CLINIC_OR_DEPARTMENT_OTHER)
Admission: RE | Admit: 2020-06-14 | Discharge: 2020-06-14 | Disposition: A | Discharge status: Home / Self Care | Payer: BC Managed Care – PPO | Source: Ambulatory Visit | Attending: Family Medicine | Admitting: Family Medicine

## 2020-06-14 DIAGNOSIS — R1011 Right upper quadrant pain: Secondary | ICD-10-CM

## 2020-06-15 ENCOUNTER — Other Ambulatory Visit: Payer: Self-pay | Admitting: Family Medicine

## 2020-06-15 DIAGNOSIS — K8020 Calculus of gallbladder without cholecystitis without obstruction: Secondary | ICD-10-CM

## 2020-06-17 ENCOUNTER — Ambulatory Visit: Payer: Self-pay | Admitting: Surgery

## 2020-06-17 NOTE — H&P (Signed)
Rhonda Le Appointment: 06/17/2020 8:45 AM Location: [facility] Patient #: [id number] DOB: 07-06-1976 Married / Language: English / Race: Black or African American Female  History of Present Illness Rhonda Hector MD; 06/17/2020 9:12 AM) The patient is a 44 year old female who presents for evaluation of gall stones. Note for "Gall stones": ` ` ` Patient sent for surgical consultation at the request of NP [name], MD  Chief Complaint: Abdominal pain. Probable gallbladder etiology. ` ` The patient is a woman whose noticed symptoms of intermittent abdominal pain for the past few months. Became more intense last month. Discuss the primary care office. Workup an ultrasound done. Gallstones noted. Suspicious for biliary colic. Surgical consultation offered. Symptoms always triggered by eating. Pain most intense in the right upper quadrant. Woken up at night especially with nausea. Tried Mylanta without much help. Dull cramping upper abdominal pain. Mainly in the right side. Does have some diabetes with steatosis and BMI 39. She does have diabetes controlled with oral hypoglycemics. She had an episode which sounds like a TIA 2 years ago felt due to uncontrolled hypertension with negative cardiac or neurological workup concerning for embolic issue. Not on any blood thinners. She can walk a half hour without difficulty. She's had a tubal ligation but no other surgeries. She tends to be constipated, moving her bowels every 2 or 3 days and needs a stool softener. Denies heartburn or reflux. She had some mild heartburn with her first pregnancy 20 years ago but none since. No dysphagia to solids and liquids. No hematochezia or melena. No coffee-ground emesis. No history of celiac sprue, irregular bowels, etc. She recalls getting an upper endoscopy years ago when she lived in [place] that was mostly [place]. Colonoscopy. Does not currently have a gastrologist  now.  (Review of systems as stated in this history (HPI) or in the review of systems. Otherwise all other 12 point ROS are negative) ` ` `  This patient encounter took 30 minutes today to perform the following: obtain history, perform exam, review outside records, interpret tests & imaging, counsel the patient on their diagnosis; and, document this encounter, including findings & plan in the electronic health record (EHR).   Past Surgical History ([name], [name]; 06/17/2020 8:40 AM) Mammoplasty; Reduction Bilateral.  Diagnostic Studies History ([name], [place]; 06/17/2020 8:40 AM) Colonoscopy never Mammogram within last year Pap Smear 1-5 years ago  Allergies ([place], CMA; 06/17/2020 8:41 AM) No Known Drug Allergies [06/17/2020]: Allergies Reconciled  Medication History ([name], CMA; 06/17/2020 8:41 AM) Atorvastatin Calcium (40MG  Tablet, Oral) Active. Dicyclomine HCl (10MG  Capsule, Oral) Active. Losartan Potassium (100MG  Tablet, Oral) Active. metFORMIN HCl (1000MG  Tablet, Oral) Active. Medications Reconciled  Social History [name], CMA; 06/17/2020 8:40 AM) Alcohol use Occasional alcohol use. Caffeine use Coffee. Illicit drug use Remotely quit drug use. Tobacco use Never smoker.  Family History ([place], [place]; 06/17/2020 8:40 AM) Bleeding disorder Mother. Heart Disease Father, Mother. Hypertension Father.  Pregnancy / Birth History [name], [place]; 06/17/2020 8:40 AM) Age at menarche 47 years. Gravida 3 Irregular periods Length (months) of breastfeeding 3-6 Maternal age 43-20 Para 2  Other Problems ([place], [place]; 06/17/2020 8:40 AM) Cerebrovascular Accident Diabetes Mellitus Heart murmur High blood pressure     Review of Systems ([name] CMA; 06/17/2020 8:40 AM) Skin Not Present- Change in Wart/Mole, Dryness, Hives, Jaundice, New Lesions, Non-Healing Wounds, Rash and Ulcer. HEENT Not Present- Earache, Hearing  Loss, Hoarseness, Nose Bleed, Oral Ulcers, Ringing in the  Ears, Seasonal Allergies, Sinus Pain, Sore Throat, Visual Disturbances, Wears glasses/contact lenses and Yellow Eyes. Gastrointestinal Present- Abdominal Pain, Bloating and Nausea. Not Present- Bloody Stool, Change in Bowel Habits, Chronic diarrhea, Constipation, Difficulty Swallowing, Excessive gas, Gets full quickly at meals, Hemorrhoids, Indigestion, Rectal Pain and Vomiting. Female Genitourinary Not Present- Frequency, Nocturia, Painful Urination, Pelvic Pain and Urgency. Musculoskeletal Not Present- Back Pain, Joint Pain, Joint Stiffness, Muscle Pain, Muscle Weakness and Swelling of Extremities. Neurological Not Present- Decreased Memory, Fainting, Headaches, Numbness, Seizures, Tingling, Tremor, Trouble walking and Weakness. Psychiatric Not Present- Anxiety, Bipolar, Change in Sleep Pattern, Depression, Fearful and Frequent crying. Endocrine Present- Hot flashes. Not Present- Cold Intolerance, Excessive Hunger, Hair Changes, Heat Intolerance and New Diabetes. Hematology Not Present- Blood Thinners, Easy Bruising, Excessive bleeding, Gland problems, HIV and Persistent Infections.  Vitals ([name] CMA; 06/17/2020 8:41 AM) 06/17/2020 8:41 AM Weight: 249.38 lb Height: 67in Body Surface Area: 2.22 m Body Mass Index: 39.06 kg/m  Temp.: 97.57F  Pulse: 89 (Regular)  BP: 130/82(Sitting, Left Arm, Standard)        Physical Exam Rhonda Hector MD; 06/17/2020 9:10 AM)  General Mental Status-Alert. General Appearance-Not in acute distress, Not Sickly. Orientation-Oriented X3. Hydration-Well hydrated. Voice-Normal.  Integumentary Global Assessment Upon inspection and palpation of skin surfaces of the - Axillae: non-tender, no inflammation or ulceration, no drainage. and Distribution of scalp and body hair is normal. General Characteristics Temperature - normal warmth is noted.  Head and  Neck Head-normocephalic, atraumatic with no lesions or palpable masses. Face Global Assessment - atraumatic, no absence of expression. Neck Global Assessment - no abnormal movements, no bruit auscultated on the right, no bruit auscultated on the left, no decreased range of motion, non-tender. Trachea-midline. Thyroid Gland Characteristics - non-tender.  Eye Eyeball - Left-Extraocular movements intact, No Nystagmus - Left. Eyeball - Right-Extraocular movements intact, No Nystagmus - Right. Cornea - Left-No Hazy - Left. Cornea - Right-No Hazy - Right. Sclera/Conjunctiva - Left-No scleral icterus, No Discharge - Left. Sclera/Conjunctiva - Right-No scleral icterus, No Discharge - Right. Pupil - Left-Direct reaction to light normal. Pupil - Right-Direct reaction to light normal.  ENMT Ears Pinna - Left - no drainage observed, no generalized tenderness observed. Pinna - Right - no drainage observed, no generalized tenderness observed. Nose and Sinuses External Inspection of the Nose - no destructive lesion observed. Inspection of the nares - Left - quiet respiration. Inspection of the nares - Right - quiet respiration. Mouth and Throat Lips - Upper Lip - no fissures observed, no pallor noted. Lower Lip - no fissures observed, no pallor noted. Nasopharynx - no discharge present. Oral Cavity/Oropharynx - Tongue - no dryness observed. Oral Mucosa - no cyanosis observed. Hypopharynx - no evidence of airway distress observed.  Chest and Lung Exam Inspection Movements - Normal and Symmetrical. Accessory muscles - No use of accessory muscles in breathing. Palpation Palpation of the chest reveals - Non-tender. Auscultation Breath sounds - Normal and Clear.  Cardiovascular Auscultation Rhythm - Regular. Murmurs & Other Heart Sounds - Auscultation of the heart reveals - No Murmurs and No Systolic Clicks.  Abdomen Inspection Inspection of the abdomen reveals - No Visible  peristalsis and No Abnormal pulsations. Umbilicus - No Bleeding, No Urine drainage. Palpation/Percussion Palpation and Percussion of the abdomen reveal - Soft, Non Tender, No Rebound tenderness, No Rigidity (guarding) and No Cutaneous hyperesthesia. Note: Abdomen soft. Obese. Mild right upper quadrant discomfort but no Murphy sign. No epigastric discomfort. Not severely distended. No distasis recti. No  umbilical or other anterior abdominal wall hernias  Female Genitourinary Sexual Maturity Tanner 5 - Adult hair pattern. Note: No vaginal bleeding nor discharge  Peripheral Vascular Upper Extremity Inspection - Left - No Cyanotic nailbeds - Left, Not Ischemic. Inspection - Right - No Cyanotic nailbeds - Right, Not Ischemic.  Neurologic Neurologic evaluation reveals -normal attention span and ability to concentrate, able to name objects and repeat phrases. Appropriate fund of knowledge , normal sensation and normal coordination. Mental Status Affect - not angry, not paranoid. Cranial Nerves-Normal Bilaterally. Gait-Normal.  Neuropsychiatric Mental status exam performed with findings of-able to articulate well with normal speech/language, rate, volume and coherence, thought content normal with ability to perform basic computations and apply abstract reasoning and no evidence of hallucinations, delusions, obsessions or homicidal/suicidal ideation.  Musculoskeletal Global Assessment Spine, Ribs and Pelvis - no instability, subluxation or laxity. Right Upper Extremity - no instability, subluxation or laxity.  Lymphatic Head & Neck  General Head & Neck Lymphatics: Bilateral - Description - No Localized lymphadenopathy. Axillary  General Axillary Region: Bilateral - Description - No Localized lymphadenopathy. Femoral & Inguinal  Generalized Femoral & Inguinal Lymphatics: Left - Description - No Localized lymphadenopathy. Right - Description - No Localized  lymphadenopathy.    Assessment & Plan Rhonda Hector MD; 06/17/2020 9:10 AM)  CHRONIC CHOLECYSTITIS WITH CALCULUS (K80.10) Impression: Rather classic story biliary colic with repeated attacks. Now with more intensity. Obvious gallstones. The rest of the differential diagnoses seems underwhelming.  I think she would benefit from cholecystectomy. Reasonable start with a single site approach. Should be an outpatient setting since she is otherwise healthy. She did have a history of a TIA 2 years ago that sounds due to uncontrolled hypertension and not embolic. Her blood pressures under control and she is followed closely by medicine. Echocardiogram showing good ejection fraction of 60-65%. No other concerning findings. She has good exercise tolerance.  Current Plans You are being scheduled for surgery- Our schedulers will call you.  You should hear from our office's scheduling department within 5 working days about the location, date, and time of surgery. We try to make accommodations for patient's preferences in scheduling surgery, but sometimes the OR schedule or the surgeon's schedule prevents [place] from making those accommodations.  If you have not heard from our office [phone]) in 5 working days, call the office and ask for your surgeon's nurse.  If you have other questions about your diagnosis, plan, or surgery, call the office and ask for your surgeon's nurse.  Written instructions provided Pt Education - Pamphlet Given - Laparoscopic Gallbladder Surgery: discussed with patient and provided information. The anatomy & physiology of hepatobiliary & pancreatic function was discussed. The pathophysiology of gallbladder dysfunction was discussed. Natural history risks without surgery was discussed. I feel the risks of no intervention will lead to serious problems that outweigh the operative risks; therefore, I recommended cholecystectomy to remove the pathology. I explained  laparoscopic techniques with possible need for an open approach. Probable cholangiogram to evaluate the bilary tract was explained as well.  Risks such as bleeding, infection, abscess, leak, injury to other organs, need for further treatment, heart attack, death, and other risks were discussed. I noted a good likelihood this will help address the problem. Possibility that this will not correct all abdominal symptoms was explained. Goals of post-operative recovery were discussed as well. We will work to minimize complications. An educational handout further explaining the pathology and treatment options was given as well. Questions were answered.  The patient expresses understanding & wishes to proceed with surgery.  Pt Education - [facility] Laparosopic Post Op HCI (Waverly Tarquinio) Pt Education - [facility] Good Bowel Health (Daphna Lafuente) Pt Education - Laparoscopic Cholecystectomy: gallbladder  DM TYPE 2 (DIABETES MELLITUS, TYPE 2) (E11.9)   HISTORY OF TIA (TRANSIENT ISCHEMIC ATTACK) (Z86.73)  Rhonda Hector, MD, FACS, MASCRS Gastrointestinal and Minimally Invasive Surgery  [facility] [address] [place], [place] [address] [phone] Fax [phone] Main/Paging  CONTACT INFORMATION: Weekday (9AM-5PM) concerns: Call [facility] main office at [phone] Weeknight (5PM-9AM) or Weekend/Holiday concerns: Check [email or web address] for General Surgery [facility] coverage (Please, do not use SecureChat as it is not reliable communication to operating surgeons for immediate patient care)

## 2020-06-17 NOTE — H&P (View-Only) (Signed)
Rhonda Le Appointment: 06/17/2020 8:45 AM Location: [facility] Patient #: [id number] DOB: 1976/07/04 Married / Language: English / Race: Black or African American Female  History of Present Illness Rhonda Le; 06/17/2020 9:12 AM) The patient is a 44 year old female who presents for evaluation of gall stones. Note for "Gall stones": ` ` ` Patient sent for surgical consultation at the request of NP [name], Le  Chief Complaint: Abdominal pain. Probable gallbladder etiology. ` ` The patient is a woman whose noticed symptoms of intermittent abdominal pain for the past few months. Became more intense last month. Discuss the primary care office. Workup an ultrasound done. Gallstones noted. Suspicious for biliary colic. Surgical consultation offered. Symptoms always triggered by eating. Pain most intense in the right upper quadrant. Woken up at night especially with nausea. Tried Mylanta without much help. Dull cramping upper abdominal pain. Mainly in the right side. Does have some diabetes with steatosis and BMI 39. She does have diabetes controlled with oral hypoglycemics. She had an episode which sounds like a TIA 2 years ago felt due to uncontrolled hypertension with negative cardiac or neurological workup concerning for embolic issue. Not on any blood thinners. She can walk a half hour without difficulty. She's had a tubal ligation but no other surgeries. She tends to be constipated, moving her bowels every 2 or 3 days and needs a stool softener. Denies heartburn or reflux. She had some mild heartburn with her first pregnancy 20 years ago but none since. No dysphagia to solids and liquids. No hematochezia or melena. No coffee-ground emesis. No history of celiac sprue, irregular bowels, etc. She recalls getting an upper endoscopy years ago when she lived in [place] that was mostly [place]. Colonoscopy. Does not currently have a gastrologist  now.  (Review of systems as stated in this history (HPI) or in the review of systems. Otherwise all other 12 point ROS are negative) ` ` `  This patient encounter took 30 minutes today to perform the following: obtain history, perform exam, review outside records, interpret tests & imaging, counsel the patient on their diagnosis; and, document this encounter, including findings & plan in the electronic health record (EHR).   Past Surgical History (Rhonda Le, [place]; 06/17/2020 8:40 AM) Mammoplasty; Reduction Bilateral.  Diagnostic Studies History (Rhonda Le, [place]; 06/17/2020 8:40 AM) Colonoscopy never Mammogram within last year Pap Smear 1-5 years ago  Allergies (Rhonda Le; 06/17/2020 8:41 AM) No Known Drug Allergies [06/17/2020]: Allergies Reconciled  Medication History (Rhonda Le, Le; 06/17/2020 8:41 AM) Atorvastatin Calcium (40MG  Tablet, Oral) Active. Dicyclomine HCl (10MG  Capsule, Oral) Active. Losartan Potassium (100MG  Tablet, Oral) Active. metFORMIN HCl (1000MG  Tablet, Oral) Active. Medications Reconciled  Social History Rhonda Le; 06/17/2020 8:40 AM) Alcohol use Occasional alcohol use. Caffeine use Coffee. Illicit drug use Remotely quit drug use. Tobacco use Never smoker.  Family History (Rhonda Le; 06/17/2020 8:40 AM) Bleeding disorder Mother. Heart Disease Father, Mother. Hypertension Father.  Pregnancy / Birth History Rhonda Jewel, [place]; 06/17/2020 8:40 AM) Age at menarche 89 years. Gravida 3 Irregular periods Length (months) of breastfeeding 3-6 Maternal age 46-20 Para 2  Other Problems (Rhonda Le; 06/17/2020 8:40 AM) Cerebrovascular Accident Diabetes Mellitus Heart murmur High blood pressure     Review of Systems (Rhonda Le; 06/17/2020 8:40 AM) Skin Not Present- Change in Wart/Mole, Dryness, Hives, Jaundice, New Lesions, Non-Healing Wounds, Rash and Ulcer. HEENT Not Present- Earache, Hearing  Loss, Hoarseness, Nose Bleed, Oral Ulcers, Ringing in the  Ears, Seasonal Allergies, Sinus Pain, Sore Throat, Visual Disturbances, Wears glasses/contact lenses and Yellow Eyes. Gastrointestinal Present- Abdominal Pain, Bloating and Nausea. Not Present- Bloody Stool, Change in Bowel Habits, Chronic diarrhea, Constipation, Difficulty Swallowing, Excessive gas, Gets full quickly at meals, Hemorrhoids, Indigestion, Rectal Pain and Vomiting. Female Genitourinary Not Present- Frequency, Nocturia, Painful Urination, Pelvic Pain and Urgency. Musculoskeletal Not Present- Back Pain, Joint Pain, Joint Stiffness, Muscle Pain, Muscle Weakness and Swelling of Extremities. Neurological Not Present- Decreased Memory, Fainting, Headaches, Numbness, Seizures, Tingling, Tremor, Trouble walking and Weakness. Psychiatric Not Present- Anxiety, Bipolar, Change in Sleep Pattern, Depression, Fearful and Frequent crying. Endocrine Present- Hot flashes. Not Present- Cold Intolerance, Excessive Hunger, Hair Changes, Heat Intolerance and New Diabetes. Hematology Not Present- Blood Thinners, Easy Bruising, Excessive bleeding, Gland problems, HIV and Persistent Infections.  Vitals (Rhonda Le; 06/17/2020 8:41 AM) 06/17/2020 8:41 AM Weight: 249.38 lb Height: 67in Body Surface Area: 2.22 m Body Mass Index: 39.06 kg/m  Temp.: 97.63F  Pulse: 89 (Regular)  BP: 130/82(Sitting, Left Arm, Standard)        Physical Exam Rhonda Le; 06/17/2020 9:10 AM)  General Mental Status-Alert. General Appearance-Not in acute distress, Not Sickly. Orientation-Oriented X3. Hydration-Well hydrated. Voice-Normal.  Integumentary Global Assessment Upon inspection and palpation of skin surfaces of the - Axillae: non-tender, no inflammation or ulceration, no drainage. and Distribution of scalp and body hair is normal. General Characteristics Temperature - normal warmth is noted.  Head and  Neck Head-normocephalic, atraumatic with no lesions or palpable masses. Face Global Assessment - atraumatic, no absence of expression. Neck Global Assessment - no abnormal movements, no bruit auscultated on the right, no bruit auscultated on the left, no decreased range of motion, non-tender. Trachea-midline. Thyroid Gland Characteristics - non-tender.  Eye Eyeball - Left-Extraocular movements intact, No Nystagmus - Left. Eyeball - Right-Extraocular movements intact, No Nystagmus - Right. Cornea - Left-No Hazy - Left. Cornea - Right-No Hazy - Right. Sclera/Conjunctiva - Left-No scleral icterus, No Discharge - Left. Sclera/Conjunctiva - Right-No scleral icterus, No Discharge - Right. Pupil - Left-Direct reaction to light normal. Pupil - Right-Direct reaction to light normal.  ENMT Ears Pinna - Left - no drainage observed, no generalized tenderness observed. Pinna - Right - no drainage observed, no generalized tenderness observed. Nose and Sinuses External Inspection of the Nose - no destructive lesion observed. Inspection of the nares - Left - quiet respiration. Inspection of the nares - Right - quiet respiration. Mouth and Throat Lips - Upper Lip - no fissures observed, no pallor noted. Lower Lip - no fissures observed, no pallor noted. Nasopharynx - no discharge present. Oral Cavity/Oropharynx - Tongue - no dryness observed. Oral Mucosa - no cyanosis observed. Hypopharynx - no evidence of airway distress observed.  Chest and Lung Exam Inspection Movements - Normal and Symmetrical. Accessory muscles - No use of accessory muscles in breathing. Palpation Palpation of the chest reveals - Non-tender. Auscultation Breath sounds - Normal and Clear.  Cardiovascular Auscultation Rhythm - Regular. Murmurs & Other Heart Sounds - Auscultation of the heart reveals - No Murmurs and No Systolic Clicks.  Abdomen Inspection Inspection of the abdomen reveals - No Visible  peristalsis and No Abnormal pulsations. Umbilicus - No Bleeding, No Urine drainage. Palpation/Percussion Palpation and Percussion of the abdomen reveal - Soft, Non Tender, No Rebound tenderness, No Rigidity (guarding) and No Cutaneous hyperesthesia. Note: Abdomen soft. Obese. Mild right upper quadrant discomfort but no Murphy sign. No epigastric discomfort. Not severely distended. No distasis recti. No  umbilical or other anterior abdominal wall hernias  Female Genitourinary Sexual Maturity Tanner 5 - Adult hair pattern. Note: No vaginal bleeding nor discharge  Peripheral Vascular Upper Extremity Inspection - Left - No Cyanotic nailbeds - Left, Not Ischemic. Inspection - Right - No Cyanotic nailbeds - Right, Not Ischemic.  Neurologic Neurologic evaluation reveals -normal attention span and ability to concentrate, able to name objects and repeat phrases. Appropriate fund of knowledge , normal sensation and normal coordination. Mental Status Affect - not angry, not paranoid. Cranial Nerves-Normal Bilaterally. Gait-Normal.  Neuropsychiatric Mental status exam performed with findings of-able to articulate well with normal speech/language, rate, volume and coherence, thought content normal with ability to perform basic computations and apply abstract reasoning and no evidence of hallucinations, delusions, obsessions or homicidal/suicidal ideation.  Musculoskeletal Global Assessment Spine, Ribs and Pelvis - no instability, subluxation or laxity. Right Upper Extremity - no instability, subluxation or laxity.  Lymphatic Head & Neck  General Head & Neck Lymphatics: Bilateral - Description - No Localized lymphadenopathy. Axillary  General Axillary Region: Bilateral - Description - No Localized lymphadenopathy. Femoral & Inguinal  Generalized Femoral & Inguinal Lymphatics: Left - Description - No Localized lymphadenopathy. Right - Description - No Localized  lymphadenopathy.    Assessment & Plan Rhonda Le; 06/17/2020 9:10 AM)  CHRONIC CHOLECYSTITIS WITH CALCULUS (K80.10) Impression: Rather classic story biliary colic with repeated attacks. Now with more intensity. Obvious gallstones. The rest of the differential diagnoses seems underwhelming.  I think she would benefit from cholecystectomy. Reasonable start with a single site approach. Should be an outpatient setting since she is otherwise healthy. She did have a history of a TIA 2 years ago that sounds due to uncontrolled hypertension and not embolic. Her blood pressures under control and she is followed closely by medicine. Echocardiogram showing good ejection fraction of 60-65%. No other concerning findings. She has good exercise tolerance.  Current Plans You are being scheduled for surgery- Our schedulers will call you.  You should hear from our office's scheduling department within 5 working days about the location, date, and time of surgery. We try to make accommodations for patient's preferences in scheduling surgery, but sometimes the OR schedule or the surgeon's schedule prevents [place] from making those accommodations.  If you have not heard from our office [phone]) in 5 working days, call the office and ask for your surgeon's nurse.  If you have other questions about your diagnosis, plan, or surgery, call the office and ask for your surgeon's nurse.  Written instructions provided Pt Education - Pamphlet Given - Laparoscopic Gallbladder Surgery: discussed with patient and provided information. The anatomy & physiology of hepatobiliary & pancreatic function was discussed. The pathophysiology of gallbladder dysfunction was discussed. Natural history risks without surgery was discussed. I feel the risks of no intervention will lead to serious problems that outweigh the operative risks; therefore, I recommended cholecystectomy to remove the pathology. I explained  laparoscopic techniques with possible need for an open approach. Probable cholangiogram to evaluate the bilary tract was explained as well.  Risks such as bleeding, infection, abscess, leak, injury to other organs, need for further treatment, heart attack, death, and other risks were discussed. I noted a good likelihood this will help address the problem. Possibility that this will not correct all abdominal symptoms was explained. Goals of post-operative recovery were discussed as well. We will work to minimize complications. An educational handout further explaining the pathology and treatment options was given as well. Questions were answered.  The patient expresses understanding & wishes to proceed with surgery.  Pt Education - [facility] Laparosopic Post Op HCI (Geraldyne Barraclough) Pt Education - [facility] Good Bowel Health (Jeramey Lanuza) Pt Education - Laparoscopic Cholecystectomy: gallbladder  DM TYPE 2 (DIABETES MELLITUS, TYPE 2) (E11.9)   HISTORY OF TIA (TRANSIENT ISCHEMIC ATTACK) (Z86.73)  Rhonda Hector, Le, FACS, MASCRS Gastrointestinal and Minimally Invasive Surgery  [facility] [address], [place], [place] [address] [phone] Fax [phone] Main/Paging  CONTACT INFORMATION: Weekday (9AM-5PM) concerns: Call [facility] main office at [phone] Weeknight (5PM-9AM) or Weekend/Holiday concerns: Check [email or web address] for General Surgery [facility] coverage (Please, do not use SecureChat as it is not reliable communication to operating surgeons for immediate patient care)

## 2020-06-21 ENCOUNTER — Other Ambulatory Visit: Payer: Self-pay | Admitting: Family Medicine

## 2020-06-21 DIAGNOSIS — R1011 Right upper quadrant pain: Secondary | ICD-10-CM

## 2020-06-25 ENCOUNTER — Other Ambulatory Visit: Payer: Self-pay | Admitting: Family Medicine

## 2020-06-25 DIAGNOSIS — R1011 Right upper quadrant pain: Secondary | ICD-10-CM

## 2020-07-11 ENCOUNTER — Other Ambulatory Visit: Payer: Self-pay

## 2020-07-11 ENCOUNTER — Encounter (HOSPITAL_BASED_OUTPATIENT_CLINIC_OR_DEPARTMENT_OTHER): Payer: Self-pay | Admitting: Surgery

## 2020-07-11 ENCOUNTER — Other Ambulatory Visit (HOSPITAL_COMMUNITY)
Admission: RE | Admit: 2020-07-11 | Discharge: 2020-07-11 | Disposition: A | Discharge status: Home / Self Care | Payer: BC Managed Care – PPO | Source: Ambulatory Visit | Attending: Surgery | Admitting: Surgery

## 2020-07-11 DIAGNOSIS — Z20822 Contact with and (suspected) exposure to covid-19: Secondary | ICD-10-CM | POA: Diagnosis not present

## 2020-07-11 DIAGNOSIS — Z01812 Encounter for preprocedural laboratory examination: Secondary | ICD-10-CM | POA: Diagnosis not present

## 2020-07-11 LAB — SARS CORONAVIRUS 2 (TAT 6-24 HRS): SARS Coronavirus 2: NEGATIVE

## 2020-07-11 NOTE — Progress Notes (Signed)
Spoke w/ via phone for pre-op interview---pt Lab needs dos----I stat 8, urine poct               Lab results------ekg 11-19-2019 epix COVID test ------07-11-2020 900  Arrive at -------1100 am 07-14-20 NPO after MN NO Solid Food.  Clear liquids from MN until---1000 am thn npo Medications to take morning of surgery -----atorvastatin Diabetic medication -----none day of surgery Patient Special Instructions -----none Pre-Op special Istructions -----none Patient verbalized understanding of instructions that were given at this phone interview. Patient denies shortness of breath, chest pain, fever, cough at this phone interview.

## 2020-07-14 ENCOUNTER — Encounter (HOSPITAL_BASED_OUTPATIENT_CLINIC_OR_DEPARTMENT_OTHER)
Admission: RE | Disposition: A | Discharge status: Home / Self Care | Payer: Self-pay | Source: Home / Self Care | Attending: Surgery

## 2020-07-14 ENCOUNTER — Ambulatory Visit (HOSPITAL_BASED_OUTPATIENT_CLINIC_OR_DEPARTMENT_OTHER): Payer: BC Managed Care – PPO | Admitting: Anesthesiology

## 2020-07-14 ENCOUNTER — Encounter (HOSPITAL_BASED_OUTPATIENT_CLINIC_OR_DEPARTMENT_OTHER): Payer: Self-pay | Admitting: Surgery

## 2020-07-14 ENCOUNTER — Ambulatory Visit (HOSPITAL_BASED_OUTPATIENT_CLINIC_OR_DEPARTMENT_OTHER)
Admission: RE | Admit: 2020-07-14 | Discharge: 2020-07-14 | Disposition: A | Discharge status: Home / Self Care | Payer: BC Managed Care – PPO | Attending: Surgery | Admitting: Surgery

## 2020-07-14 ENCOUNTER — Ambulatory Visit (HOSPITAL_COMMUNITY): Payer: BC Managed Care – PPO

## 2020-07-14 DIAGNOSIS — E669 Obesity, unspecified: Secondary | ICD-10-CM | POA: Insufficient documentation

## 2020-07-14 DIAGNOSIS — K7581 Nonalcoholic steatohepatitis (NASH): Secondary | ICD-10-CM | POA: Diagnosis not present

## 2020-07-14 DIAGNOSIS — Z832 Family history of diseases of the blood and blood-forming organs and certain disorders involving the immune mechanism: Secondary | ICD-10-CM | POA: Diagnosis not present

## 2020-07-14 DIAGNOSIS — K8060 Calculus of gallbladder and bile duct with cholecystitis, unspecified, without obstruction: Secondary | ICD-10-CM | POA: Diagnosis present

## 2020-07-14 DIAGNOSIS — I10 Essential (primary) hypertension: Secondary | ICD-10-CM | POA: Diagnosis present

## 2020-07-14 DIAGNOSIS — E119 Type 2 diabetes mellitus without complications: Secondary | ICD-10-CM | POA: Diagnosis not present

## 2020-07-14 DIAGNOSIS — K8010 Calculus of gallbladder with chronic cholecystitis without obstruction: Secondary | ICD-10-CM | POA: Diagnosis not present

## 2020-07-14 DIAGNOSIS — Z8249 Family history of ischemic heart disease and other diseases of the circulatory system: Secondary | ICD-10-CM | POA: Insufficient documentation

## 2020-07-14 DIAGNOSIS — Z6837 Body mass index (BMI) 37.0-37.9, adult: Secondary | ICD-10-CM | POA: Insufficient documentation

## 2020-07-14 DIAGNOSIS — Z7984 Long term (current) use of oral hypoglycemic drugs: Secondary | ICD-10-CM | POA: Insufficient documentation

## 2020-07-14 DIAGNOSIS — Z8673 Personal history of transient ischemic attack (TIA), and cerebral infarction without residual deficits: Secondary | ICD-10-CM | POA: Diagnosis not present

## 2020-07-14 DIAGNOSIS — K828 Other specified diseases of gallbladder: Secondary | ICD-10-CM | POA: Insufficient documentation

## 2020-07-14 DIAGNOSIS — Z79899 Other long term (current) drug therapy: Secondary | ICD-10-CM | POA: Insufficient documentation

## 2020-07-14 DIAGNOSIS — R011 Cardiac murmur, unspecified: Secondary | ICD-10-CM | POA: Diagnosis not present

## 2020-07-14 DIAGNOSIS — Z419 Encounter for procedure for purposes other than remedying health state, unspecified: Secondary | ICD-10-CM

## 2020-07-14 HISTORY — PX: LAPAROSCOPIC CHOLECYSTECTOMY SINGLE SITE WITH INTRAOPERATIVE CHOLANGIOGRAM: SHX6538

## 2020-07-14 HISTORY — DX: Type 2 diabetes mellitus without complications: E11.9

## 2020-07-14 HISTORY — DX: Calculus of bile duct without cholangitis or cholecystitis without obstruction: K80.50

## 2020-07-14 LAB — POCT I-STAT, CHEM 8
BUN: 10 mg/dL (ref 6–20)
Calcium, Ion: 1.24 mmol/L (ref 1.15–1.40)
Chloride: 104 mmol/L (ref 98–111)
Creatinine, Ser: 0.8 mg/dL (ref 0.44–1.00)
Glucose, Bld: 115 mg/dL — ABNORMAL HIGH (ref 70–99)
HCT: 37 % (ref 36.0–46.0)
Hemoglobin: 12.6 g/dL (ref 12.0–15.0)
Potassium: 4.2 mmol/L (ref 3.5–5.1)
Sodium: 139 mmol/L (ref 135–145)
TCO2: 22 mmol/L (ref 22–32)

## 2020-07-14 LAB — POCT PREGNANCY, URINE: Preg Test, Ur: NEGATIVE

## 2020-07-14 LAB — GLUCOSE, CAPILLARY: Glucose-Capillary: 157 mg/dL — ABNORMAL HIGH (ref 70–99)

## 2020-07-14 SURGERY — LAPAROSCOPIC CHOLECYSTECTOMY SINGLE SITE WITH INTRAOPERATIVE CHOLANGIOGRAM
Anesthesia: General | Site: Abdomen

## 2020-07-14 MED ORDER — FENTANYL CITRATE (PF) 250 MCG/5ML IJ SOLN
INTRAMUSCULAR | Status: AC
  Filled 2020-07-14: qty 5

## 2020-07-14 MED ORDER — PROMETHAZINE HCL 25 MG/ML IJ SOLN: 6.2500 mg | INTRAMUSCULAR | Status: DC | PRN

## 2020-07-14 MED ORDER — ROCURONIUM BROMIDE 10 MG/ML (PF) SYRINGE
PREFILLED_SYRINGE | INTRAVENOUS | Status: AC
  Filled 2020-07-14: qty 10

## 2020-07-14 MED ORDER — SODIUM CHLORIDE 0.9 % IV SOLN: INTRAVENOUS | Status: DC

## 2020-07-14 MED ORDER — BUPIVACAINE-EPINEPHRINE 0.25% -1:200000 IJ SOLN
INTRAMUSCULAR | Status: DC | PRN
  Administered 2020-07-14: 50 mL

## 2020-07-14 MED ORDER — LACTATED RINGERS IV SOLN: INTRAVENOUS | Status: DC | PRN

## 2020-07-14 MED ORDER — PROPOFOL 10 MG/ML IV BOLUS
INTRAVENOUS | Status: AC
  Filled 2020-07-14: qty 20

## 2020-07-14 MED ORDER — ONDANSETRON HCL 4 MG/2ML IJ SOLN
INTRAMUSCULAR | Status: AC
  Filled 2020-07-14: qty 2

## 2020-07-14 MED ORDER — BUPIVACAINE LIPOSOME 1.3 % IJ SUSP: 20.0000 mL | Freq: Once | INTRAMUSCULAR | Status: DC

## 2020-07-14 MED ORDER — GABAPENTIN 300 MG PO CAPS
300.0000 mg | ORAL_CAPSULE | ORAL | Status: AC
  Administered 2020-07-14: 300 mg via ORAL

## 2020-07-14 MED ORDER — BUPIVACAINE-EPINEPHRINE 0.25% -1:200000 IJ SOLN
INTRAMUSCULAR | Status: AC
  Filled 2020-07-14: qty 1

## 2020-07-14 MED ORDER — FENTANYL CITRATE (PF) 100 MCG/2ML IJ SOLN
INTRAMUSCULAR | Status: AC
  Filled 2020-07-14: qty 2

## 2020-07-14 MED ORDER — ONDANSETRON HCL 4 MG PO TABS: 4.0000 mg | ORAL_TABLET | Freq: Three times a day (TID) | ORAL | 5 refills | Status: DC | PRN

## 2020-07-14 MED ORDER — SUGAMMADEX SODIUM 200 MG/2ML IV SOLN
INTRAVENOUS | Status: DC | PRN
  Administered 2020-07-14: 300 mg via INTRAVENOUS

## 2020-07-14 MED ORDER — CHLORHEXIDINE GLUCONATE CLOTH 2 % EX PADS: 6.0000 | MEDICATED_PAD | Freq: Once | CUTANEOUS | Status: DC

## 2020-07-14 MED ORDER — FENTANYL CITRATE (PF) 100 MCG/2ML IJ SOLN
25.0000 ug | INTRAMUSCULAR | Status: DC | PRN
  Administered 2020-07-14 (×2): 25 ug via INTRAVENOUS

## 2020-07-14 MED ORDER — PROPOFOL 10 MG/ML IV BOLUS
INTRAVENOUS | Status: DC | PRN
  Administered 2020-07-14: 160 mg via INTRAVENOUS

## 2020-07-14 MED ORDER — MIDAZOLAM HCL 5 MG/5ML IJ SOLN
INTRAMUSCULAR | Status: DC | PRN
  Administered 2020-07-14 (×2): 1 mg via INTRAVENOUS

## 2020-07-14 MED ORDER — SODIUM CHLORIDE 0.9 % IR SOLN
Status: DC | PRN
  Administered 2020-07-14: 3000 mL

## 2020-07-14 MED ORDER — OXYCODONE HCL 5 MG PO TABS
ORAL_TABLET | ORAL | Status: AC
  Filled 2020-07-14: qty 1

## 2020-07-14 MED ORDER — PHENYLEPHRINE 40 MCG/ML (10ML) SYRINGE FOR IV PUSH (FOR BLOOD PRESSURE SUPPORT)
PREFILLED_SYRINGE | INTRAVENOUS | Status: AC
  Filled 2020-07-14: qty 10

## 2020-07-14 MED ORDER — DEXAMETHASONE SODIUM PHOSPHATE 10 MG/ML IJ SOLN
INTRAMUSCULAR | Status: DC | PRN
  Administered 2020-07-14: 5 mg via INTRAVENOUS

## 2020-07-14 MED ORDER — EPHEDRINE SULFATE 50 MG/ML IJ SOLN
INTRAMUSCULAR | Status: DC | PRN
  Administered 2020-07-14: 5 mg via INTRAVENOUS

## 2020-07-14 MED ORDER — METRONIDAZOLE IN NACL 5-0.79 MG/ML-% IV SOLN
500.0000 mg | INTRAVENOUS | Status: AC
  Administered 2020-07-14: 500 mg via INTRAVENOUS

## 2020-07-14 MED ORDER — METRONIDAZOLE IN NACL 5-0.79 MG/ML-% IV SOLN
INTRAVENOUS | Status: AC
  Filled 2020-07-14: qty 100

## 2020-07-14 MED ORDER — CEFTRIAXONE SODIUM 2 G IJ SOLR
INTRAMUSCULAR | Status: AC
  Filled 2020-07-14: qty 20

## 2020-07-14 MED ORDER — ENSURE PRE-SURGERY PO LIQD: 296.0000 mL | Freq: Once | ORAL | Status: DC

## 2020-07-14 MED ORDER — LACTATED RINGERS IV SOLN: INTRAVENOUS | Status: DC

## 2020-07-14 MED ORDER — BUPIVACAINE LIPOSOME 1.3 % IJ SUSP
INTRAMUSCULAR | Status: AC
  Filled 2020-07-14: qty 20

## 2020-07-14 MED ORDER — SODIUM CHLORIDE (PF) 0.9 % IJ SOLN
INTRAMUSCULAR | Status: AC
  Filled 2020-07-14: qty 50

## 2020-07-14 MED ORDER — TRAMADOL HCL 50 MG PO TABS: 50.0000 mg | ORAL_TABLET | Freq: Four times a day (QID) | ORAL | 0 refills | Status: DC | PRN

## 2020-07-14 MED ORDER — DEXAMETHASONE SODIUM PHOSPHATE 10 MG/ML IJ SOLN
INTRAMUSCULAR | Status: AC
  Filled 2020-07-14: qty 1

## 2020-07-14 MED ORDER — EPHEDRINE 5 MG/ML INJ
INTRAVENOUS | Status: AC
  Filled 2020-07-14: qty 10

## 2020-07-14 MED ORDER — OXYCODONE HCL 5 MG PO TABS
5.0000 mg | ORAL_TABLET | Freq: Once | ORAL | Status: AC | PRN
  Administered 2020-07-14: 5 mg via ORAL

## 2020-07-14 MED ORDER — SODIUM CHLORIDE 0.9 % IV SOLN
INTRAVENOUS | Status: AC
  Filled 2020-07-14: qty 100

## 2020-07-14 MED ORDER — FENTANYL CITRATE (PF) 100 MCG/2ML IJ SOLN
INTRAMUSCULAR | Status: DC | PRN
Start: 2020-07-14 — End: 2020-07-14
  Administered 2020-07-14: 25 ug via INTRAVENOUS
  Administered 2020-07-14: 50 ug via INTRAVENOUS
  Administered 2020-07-14: 25 ug via INTRAVENOUS
  Administered 2020-07-14 (×3): 50 ug via INTRAVENOUS

## 2020-07-14 MED ORDER — ACETAMINOPHEN 500 MG PO TABS
1000.0000 mg | ORAL_TABLET | ORAL | Status: AC
  Administered 2020-07-14: 1000 mg via ORAL

## 2020-07-14 MED ORDER — IOHEXOL 300 MG/ML  SOLN
INTRAMUSCULAR | Status: DC | PRN
  Administered 2020-07-14: 5 mL

## 2020-07-14 MED ORDER — MIDAZOLAM HCL 2 MG/2ML IJ SOLN
INTRAMUSCULAR | Status: AC
  Filled 2020-07-14: qty 2

## 2020-07-14 MED ORDER — GABAPENTIN 300 MG PO CAPS
ORAL_CAPSULE | ORAL | Status: AC
  Filled 2020-07-14: qty 1

## 2020-07-14 MED ORDER — CELECOXIB 200 MG PO CAPS
200.0000 mg | ORAL_CAPSULE | ORAL | Status: AC
  Administered 2020-07-14: 200 mg via ORAL

## 2020-07-14 MED ORDER — OXYCODONE HCL 5 MG/5ML PO SOLN: 5.0000 mg | Freq: Once | ORAL | Status: AC | PRN

## 2020-07-14 MED ORDER — BUPIVACAINE LIPOSOME 1.3 % IJ SUSP
INTRAMUSCULAR | Status: DC | PRN
  Administered 2020-07-14: 20 mL

## 2020-07-14 MED ORDER — LIDOCAINE HCL (CARDIAC) PF 100 MG/5ML IV SOSY
PREFILLED_SYRINGE | INTRAVENOUS | Status: DC | PRN
  Administered 2020-07-14: 80 mg via INTRAVENOUS

## 2020-07-14 MED ORDER — PHENYLEPHRINE HCL (PRESSORS) 10 MG/ML IV SOLN
INTRAVENOUS | Status: DC | PRN
  Administered 2020-07-14: 120 ug via INTRAVENOUS

## 2020-07-14 MED ORDER — SODIUM CHLORIDE 0.9 % IV SOLN
2.0000 g | INTRAVENOUS | Status: AC
  Administered 2020-07-14: 2 g via INTRAVENOUS

## 2020-07-14 MED ORDER — ACETAMINOPHEN 500 MG PO TABS
ORAL_TABLET | ORAL | Status: AC
  Filled 2020-07-14: qty 2

## 2020-07-14 MED ORDER — ONDANSETRON HCL 4 MG/2ML IJ SOLN
INTRAMUSCULAR | Status: DC | PRN
  Administered 2020-07-14: 4 mg via INTRAVENOUS

## 2020-07-14 MED ORDER — ROCURONIUM BROMIDE 100 MG/10ML IV SOLN
INTRAVENOUS | Status: DC | PRN
  Administered 2020-07-14: 60 mg via INTRAVENOUS
  Administered 2020-07-14: 20 mg via INTRAVENOUS

## 2020-07-14 MED ORDER — CELECOXIB 200 MG PO CAPS
ORAL_CAPSULE | ORAL | Status: AC
  Filled 2020-07-14: qty 1

## 2020-07-14 MED ORDER — LIDOCAINE 2% (20 MG/ML) 5 ML SYRINGE
INTRAMUSCULAR | Status: AC
  Filled 2020-07-14: qty 5

## 2020-07-14 SURGICAL SUPPLY — 47 items
APL PRP STRL LF DISP 70% ISPRP (MISCELLANEOUS) ×2
APPLIER CLIP 5 13 M/L LIGAMAX5 (MISCELLANEOUS) ×3
APR CLP MED LRG 5 ANG JAW (MISCELLANEOUS) ×2
BAG SPEC RTRVL 10 TROC 200 (ENDOMECHANICALS) ×2
CABLE HIGH FREQUENCY MONO STRZ (ELECTRODE) ×3 IMPLANT
CHLORAPREP W/TINT 26 (MISCELLANEOUS) ×3 IMPLANT
CLIP APPLIE 5 13 M/L LIGAMAX5 (MISCELLANEOUS) ×2 IMPLANT
CNTNR URN SCR LID CUP LEK RST (MISCELLANEOUS) IMPLANT
CONT SPEC 4OZ STRL OR WHT (MISCELLANEOUS)
COVER MAYO STAND STRL (DRAPES) ×3 IMPLANT
COVER WAND RF STERILE (DRAPES) ×3 IMPLANT
DECANTER SPIKE VIAL GLASS SM (MISCELLANEOUS) ×3 IMPLANT
DRAIN CHANNEL 19F RND (DRAIN) IMPLANT
DRAPE C-ARM 42X120 X-RAY (DRAPES) ×3 IMPLANT
DRAPE C-ARMOR (DRAPES) ×3 IMPLANT
DRAPE WARM FLUID 44X44 (DRAPES) ×3 IMPLANT
DRSG TEGADERM 4X4.75 (GAUZE/BANDAGES/DRESSINGS) ×3 IMPLANT
ELECT REM PT RETURN 9FT ADLT (ELECTROSURGICAL) ×3
ELECTRODE REM PT RTRN 9FT ADLT (ELECTROSURGICAL) ×2 IMPLANT
ENDOLOOP SUT PDS II  0 18 (SUTURE) ×1
ENDOLOOP SUT PDS II 0 18 (SUTURE) ×2 IMPLANT
EVACUATOR SILICONE 100CC (DRAIN) IMPLANT
GLOVE ECLIPSE 8.0 STRL XLNG CF (GLOVE) ×3 IMPLANT
GLOVE INDICATOR 8.0 STRL GRN (GLOVE) ×3 IMPLANT
GOWN STRL REUS W/TWL XL LVL3 (GOWN DISPOSABLE) ×3 IMPLANT
IRRIG SUCT STRYKERFLOW 2 WTIP (MISCELLANEOUS) ×3
IRRIGATION SUCT STRKRFLW 2 WTP (MISCELLANEOUS) ×2 IMPLANT
NEEDLE INSUFFLATION 120MM (ENDOMECHANICALS) IMPLANT
PACK BASIN DAY SURGERY FS (CUSTOM PROCEDURE TRAY) ×3 IMPLANT
PAD POSITIONING PINK XL (MISCELLANEOUS) ×3 IMPLANT
POUCH RETRIEVAL ECOSAC 10 (ENDOMECHANICALS) ×2 IMPLANT
POUCH RETRIEVAL ECOSAC 10MM (ENDOMECHANICALS) ×1
SCISSORS LAP 5X35 DISP (ENDOMECHANICALS) ×3 IMPLANT
SET CHOLANGIOGRAPH MIX (MISCELLANEOUS) ×3 IMPLANT
SET TUBE SMOKE EVAC HIGH FLOW (TUBING) ×3 IMPLANT
SHEARS HARMONIC ACE PLUS 36CM (ENDOMECHANICALS) ×3 IMPLANT
SPONGE GAUZE 2X2 8PLY STRL LF (GAUZE/BANDAGES/DRESSINGS) ×6 IMPLANT
SUT MNCRL AB 4-0 PS2 18 (SUTURE) ×3 IMPLANT
SUT PDS AB 1 CT1 27 (SUTURE) ×6 IMPLANT
SUT VIC AB 2-0 UR6 27 (SUTURE) IMPLANT
SYR 20ML LL LF (SYRINGE) IMPLANT
TOWEL OR 17X26 10 PK STRL BLUE (TOWEL DISPOSABLE) ×3 IMPLANT
TRAY LAPAROSCOPIC (CUSTOM PROCEDURE TRAY) ×3 IMPLANT
TROCAR 5M 150ML BLDLS (TROCAR) ×3 IMPLANT
TROCAR BLADELESS OPT 5 100 (ENDOMECHANICALS) ×3 IMPLANT
TROCAR XCEL NON-BLD 11X100MML (ENDOMECHANICALS) IMPLANT
TROCAR Z-THREAD FIOS 5X100MM (TROCAR) ×3 IMPLANT

## 2020-07-14 NOTE — Progress Notes (Signed)
Assumed care of patient from [place], [place]

## 2020-07-14 NOTE — Anesthesia Procedure Notes (Signed)
Procedure Name: Intubation Date/Time: 07/14/2020 12:20 PM Performed by: Garrel Ridgel, CRNA Pre-anesthesia Checklist: Patient identified, Emergency Drugs available, Suction available and Patient being monitored Patient Re-evaluated:Patient Re-evaluated prior to induction Oxygen Delivery Method: Circle system utilized Preoxygenation: Pre-oxygenation with 100% oxygen Induction Type: IV induction Ventilation: Mask ventilation without difficulty Laryngoscope Size: Miller and 3 Grade View: Grade I Tube type: Oral Tube size: 7.0 mm Number of attempts: 1 Airway Equipment and Method: Stylet and Oral airway Placement Confirmation: ETT inserted through vocal cords under direct vision,  positive ETCO2 and breath sounds checked- equal and bilateral Secured at: 21 cm Tube secured with: Tape Dental Injury: Teeth and Oropharynx as per pre-operative assessment

## 2020-07-14 NOTE — Anesthesia Preprocedure Evaluation (Addendum)
Anesthesia Evaluation  Patient identified by MRN, date of birth, ID band Patient awake    Reviewed: Allergy & Precautions, NPO status , Patient's Chart, lab work & pertinent test results  History of Anesthesia Complications Negative for: history of anesthetic complications  Airway Mallampati: II  TM Distance: >3 FB Neck ROM: Full    Dental  (+) Dental Advisory Given, Teeth Intact   Pulmonary neg pulmonary ROS,    Pulmonary exam normal        Cardiovascular hypertension, Pt. on medications Normal cardiovascular exam     Neuro/Psych TIAnegative psych ROS   GI/Hepatic negative GI ROS, Neg liver ROS,   Endo/Other  diabetes, Type 2, Oral Hypoglycemic Agents Obesity   Renal/GU negative Renal ROS     Musculoskeletal negative musculoskeletal ROS (+)   Abdominal   Peds  Hematology negative hematology ROS (+)   Anesthesia Other Findings Covid test negative   Reproductive/Obstetrics  S/p tubal ligation                             Anesthesia Physical Anesthesia Plan  ASA: II  Anesthesia Plan: General   Post-op Pain Management:    Induction: Intravenous  PONV Risk Score and Plan: 3 and Treatment may vary due to age or medical condition, Ondansetron, Dexamethasone, Midazolam and Scopolamine patch - Pre-op  Airway Management Planned: Oral ETT  Additional Equipment: None  Intra-op Plan:   Post-operative Plan: Extubation in OR  Informed Consent: I have reviewed the patients History and Physical, chart, labs and discussed the procedure including the risks, benefits and alternatives for the proposed anesthesia with the patient or authorized representative who has indicated his/her understanding and acceptance.     Dental advisory given  Plan Discussed with: CRNA and Anesthesiologist  Anesthesia Plan Comments:        Anesthesia Quick Evaluation

## 2020-07-14 NOTE — Op Note (Signed)
07/14/2020  PATIENT:  Rhonda Le  44 y.o. female  Patient Care Team: Shelda Pal, DO as PCP - General (Family Medicine) Michael Boston, MD as Consulting Physician (General Surgery) [name], NP as Nurse Practitioner (Gynecology)  PRE-OPERATIVE DIAGNOSIS:    Chronic Calculus cholecystitis  POST-OPERATIVE DIAGNOSIS:   Chronic Calculus cholecystitis  Liver: Fatty steatohepatitis  PROCEDURE:  SINGLE SITE Laparoscopic cholecystectomy with intraoperative cholangiogram  SURGEON:  [name], MD, FACS.  ASSISTANT: Daiva Huge, MD, PGY-7, [facility] I was personally present during the key and critical portions of this procedure and immediately available throughout the entire procedure, as documented in my operative note.    ANESTHESIA:    General with endotracheal intubation Local anesthetic as a field block  EBL:  (See Anesthesia Intraoperative Record) Total I/O In: 750 [I.V.:750] Out: 20 [Blood:20]  Delay start of Pharmacological VTE agent (>24hrs) due to surgical blood loss or risk of bleeding:  no  DRAINS: None   SPECIMEN: Gallbladder    DISPOSITION OF SPECIMEN:  PATHOLOGY  COUNTS:  YES  PLAN OF CARE: Discharge to home after PACU  PATIENT DISPOSITION:  PACU - hemodynamically stable.  INDICATION: Pleasant woman with biliary colic and gallstones.  Rest of the differential diagnosis seemed unlikely for her symptoms.  I offered cholecystectomy  The anatomy & physiology of hepatobiliary & pancreatic function was discussed.  The pathophysiology of gallbladder dysfunction was discussed.  Natural history risks without surgery was discussed.   I feel the risks of no intervention will lead to serious problems that outweigh the operative risks; therefore, I recommended cholecystectomy to remove the pathology.  I explained laparoscopic techniques with possible need for an open approach.  Probable cholangiogram to evaluate the bilary tract  was explained as well.    Risks such as bleeding, infection, abscess, leak, injury to other organs, need for further treatment, heart attack, death, and other risks were discussed.  I noted a good likelihood this will help address the problem.  Possibility that this will not correct all abdominal symptoms was explained.  Goals of post-operative recovery were discussed as well.  We will work to minimize complications.  An educational handout further explaining the pathology and treatment options was given as well.  Questions were answered.  The patient expresses understanding & wishes to proceed with surgery.  OR FINDINGS: Chronically thickened gallbladder with some anterior adhesions consistent with chronic calculus cholecystitis.  Cholangiogram showing classic biliary anatomy with no obstruction or stones.  Liver: Fatty steatohepatitis  DESCRIPTION:   The patient was identified & brought in the operating room. The patient was positioned supine with arms tucked. SCDs were active during the entire case. The patient underwent general anesthesia without any difficulty.  The abdomen was prepped and draped in a sterile fashion. A Surgical Timeout confirmed our plan.  I made a transverse curvilinear incision through the superior umbilical fold.  I placed a 74mm long port through the supraumbilical fascia using a modified Hassan cutdown technique with umbilical stalk fascial countertraction. I began carbon dioxide insufflation.  No change in end tidal CO2 measurement.   Camera inspection revealed no injury. There were no adhesions to the anterior abdominal wall supraumbilically.  I proceeded to continue with single site technique. I placed a #5 port in left upper aspect of the wound. I placed a 5 mm atraumatic grasper in the right inferior aspect of the wound.  We turned attention to the right upper quadrant.  Gallbladder wall was thickened.  Extended.  Obviously full of stones.  The gallbladder fundus was  elevated cephalad. I freed adhesions to the ventral surface of the gallbladder off carefully.  Duodenal bulb and mesocolon.  I freed the peritoneal coverings between the gallbladder and the liver on the posteriolateral and anteriomedial walls. I alternated between Harmonic & blunt Maryland dissection to help get a good critical view of the cystic artery and cystic duct.  We did further dissection to free 80% of the gallbladder off the liver bed to get a good critical view of the infundibulum and cystic duct. We dissected out the cystic artery; and, after getting a good 360 view, ligated the anterior & posterior branches of the cystic artery close on the infundibulum using the Harmonic ultrasonic dissection.  We freed off the remaining attachments of the gallbladder to the liver to help straighten out and placed under better tension.  We skeletonized the cystic duct.  I placed a clip on the infundibulum. I did a partial cystic duct-otomy and ensured patency. I placed a 5 [place] cholangiocatheter through a puncture site at the right subcostal ridge of the abdominal wall and directed it into the cystic duct.  We ran a cholangiogram with dilute radio-opaque contrast and continuous fluoroscopy. Contrast flowed from a side branch consistent with cystic duct cannulization. Contrast flowed up the common hepatic duct into the right and left intrahepatic chains out to secondary radicals. Contrast flowed down the common bile duct easily across the normal ampulla into the duodenum.  This was consistent with a normal cholangiogram.  We removed the cholangiocatheter.  He has a cystic duct was somewhat thickened we decided to ligate with a 0 PDS Endoloop to lasso around all the gallbladder and ligate the cystic duct proximally.  We placed clips on the cystic duct x4.  I completed cystic duct transection. I freed the gallbladder from its remaining attachments to the liver. I ensured hemostasis on the gallbladder fossa of the  liver and elsewhere. I inspected the rest of the abdomen & detected no injury nor bleeding elsewhere.  I removed the gallbladder out the supraumbilical fascia. I closed the fascia transversely using #1 PDS interrupted stitches. I closed the skin using 4-0 monocryl stitch.  Sterile dressing was applied. The patient was extubated & arrived in the PACU in stable condition..  I had discussed postoperative care with the patient in the holding area. I discussed operative findings, updated the patient's status, discussed probable steps to recovery, and gave postoperative recommendations to the patient's spouse, Bitha Fauteux.  Recommendations were made.  Questions were answered.  He expressed understanding & appreciation.  [name], M.D., F.A.C.S. Gastrointestinal and Minimally Invasive Surgery [facility] [address] [place], [place] [address] [phone] Main / Paging  07/14/2020 2:15 PM

## 2020-07-14 NOTE — Interval H&P Note (Signed)
History and Physical Interval Note:  07/14/2020 11:14 AM  Rhonda Le  has presented today for surgery, with the diagnosis of SYMPTOMATIC BILIARY COLIC, PROBABLE CHRONIC CHOLECYSTITIS.  The various methods of treatment have been discussed with the patient and family. After consideration of risks, benefits and other options for treatment, the patient has consented to  Procedure(s): [place] CHOLANGIOGRAM (N/A) LIVER BIOPSY, POSSIBLE NEEDLE CORE BIOPSY (N/A) as a surgical intervention.  The patient's history has been reviewed, patient examined, no change in status, stable for surgery.  I have reviewed the patient's chart and labs.  Questions were answered to the patient's satisfaction.    I have re-reviewed the the patient's records, history, medications, and allergies.  I have re-examined the patient.  I again discussed intraoperative plans and goals of post-operative recovery.  The patient agrees to proceed.  Rhonda Le  04-16-1976 [id number]  Patient Care Team: Shelda Pal, DO as PCP - General (Family Medicine) Michael Boston, MD as Consulting Physician (General Surgery) [name], NP as Nurse Practitioner (Gynecology)  Patient Active Problem List   Diagnosis Date Noted   Chronic calculous cholecystitis 07/14/2020   Other hyperlipidemia 03/07/2020   Vitamin D deficiency 01/11/2020   Prediabetes 01/07/2020   Diabetes mellitus ([place]) 01/07/2020   Class 2 severe obesity with serious comorbidity and body mass index (BMI) of 38.0 to 38.9 in adult [facility]) 12/09/2019   Hyperlipidemia associated with type 2 diabetes mellitus ([place]) 11/19/2019   Essential hypertension 12/12/2017   TIA (transient ischemic attack) 10/21/2017   Anemia 10/21/2017   Type 2 diabetes mellitus with obesity ([place]) 10/21/2017    Past Medical History:  Diagnosis Date   Anemia    iron pill now   Biliary colic    Constipation    High  cholesterol    Hypertension    Morbid obesity ([place])    TIA (transient ischemic attack) 2019   caused by extreme stress, no residual   Type 2 diabetes mellitus ([place])     Past Surgical History:  Procedure Laterality Date   BREAST REDUCTION SURGERY Bilateral 2004   TUBAL LIGATION  2003    Social History   Socioeconomic History   Marital status: Married    Spouse name: Not on file   Number of children: Not on file   Years of education: Not on file   Highest education level: Not on file  Occupational History   Occupation: Therapist, art Specialist  Tobacco Use   Smoking status: Never Smoker   Smokeless tobacco: Never Used  Vaping Use   Vaping Use: Never used  Substance and Sexual Activity   Alcohol use: Yes    Alcohol/week: 1.0 standard drink    Types: 1 Glasses of wine per week    Comment: 2 glass wine qod   Drug use: No   Sexual activity: Yes    Birth control/protection: Surgical  Other Topics Concern   Not on file  Social History Narrative   Not on file   Social Determinants of Health   Financial Resource Strain:    Difficulty of Paying Living Expenses: Not on file  Food Insecurity:    Worried About Charity fundraiser in the Last Year: Not on file   [employer] of Food in the Last Year: Not on file  Transportation Needs:    Lack of Transportation (Medical): Not on file   Lack of Transportation (Non-Medical): Not on file  Physical Activity:    Days  of Exercise per Week: Not on file   Minutes of Exercise per Session: Not on file  Stress:    Feeling of Stress : Not on file  Social Connections:    Frequency of Communication with Friends and Family: Not on file   Frequency of Social Gatherings with Friends and Family: Not on file   Attends Religious Services: Not on file   Active Member of Clubs or Organizations: Not on file   Attends Archivist Meetings: Not on file   Marital Status: Not on file  Intimate Partner Violence:    Fear of Current or  Ex-Partner: Not on file   Emotionally Abused: Not on file   Physically Abused: Not on file   Sexually Abused: Not on file    Family History  Problem Relation Age of Onset   Stroke Mother    Heart disease Mother    Depression Mother    High blood pressure Mother    High Cholesterol Mother    Sudden death Mother        passed away at 86   Alcoholism Mother    Drug abuse Mother    Stroke Maternal Aunt    Depression Father    High Cholesterol Father    High blood pressure Father    Alcoholism Father    Drug abuse Father    Sudden death Brother 38       train accident - tragic    Medications Prior to Admission  Medication Sig Dispense Refill Last Dose   Black Cohosh 40 MG CAPS Take by mouth daily.      atorvastatin (LIPITOR) 40 MG tablet TAKE 1 TABLET BY MOUTH EVERY DAY (Patient taking differently: daily. ) 90 tablet 1    dicyclomine (BENTYL) 10 MG capsule TAKE 1 TAB EVERY 6 HOURS AS NEEDED FOR ABDOMINAL CRAMPING. 360 capsule 1    ferrous sulfate (FER-IN-SOL) 75 (15 Fe) MG/ML SOLN Take 15 mg of iron by mouth daily. Iron liquid 2 x week      glucose blood (ONETOUCH ULTRA) test strip Use once daily to check blood sugar.  DX E11.9 100 each 3    Lancets (ONETOUCH ULTRASOFT) lancets Use once daily to check blood sugar.  DX E11.9 100 each 3    losartan (COZAAR) 100 MG tablet TAKE 1 TABLET BY MOUTH EVERY DAY (Patient taking differently: daily. ) 90 tablet 2    metFORMIN (GLUCOPHAGE) 1000 MG tablet Take 1 tablet (1,000 mg total) by mouth daily.      Vitamin D, Ergocalciferol, (DRISDOL) 1.25 MG (50000 UNIT) CAPS capsule TAKE 1 CAPSULE (50,000 UNITS TOTAL) BY MOUTH EVERY 7 (SEVEN) DAYS. (Patient taking differently: Take 50,000 Units by mouth every 7 (seven) days. monday) 12 capsule 0     Current Facility-Administered Medications  Medication Dose Route Frequency Provider Last Rate Last Admin   0.9 %  sodium chloride infusion   Intravenous Continuous Ellender, Karyl Kinnier, MD       acetaminophen  (TYLENOL) tablet 1,000 mg  1,000 mg Oral On Call to OR Michael Boston, MD       bupivacaine liposome (EXPAREL) 1.3 % injection 266 mg  20 mL Infiltration Once Michael Boston, MD       cefTRIAXone (ROCEPHIN) 2 g in sodium chloride 0.9 % 100 mL IVPB  2 g Intravenous On Call to OR Michael Boston, MD       And   metroNIDAZOLE (FLAGYL) IVPB 500 mg  500 mg Intravenous On Call to  OR Michael Boston, MD       celecoxib (CELEBREX) capsule 200 mg  200 mg Oral On Call to OR Michael Boston, MD       Chlorhexidine Gluconate Cloth 2 % PADS 6 each  6 each Topical Once Michael Boston, MD       And   Chlorhexidine Gluconate Cloth 2 % PADS 6 each  6 each Topical Once Michael Boston, MD       Derrill Memo ON 07/15/2020] feeding supplement (ENSURE PRE-SURGERY) liquid 296 mL  296 mL Oral Once Michael Boston, MD       gabapentin (NEURONTIN) capsule 300 mg  300 mg Oral On Call to OR Michael Boston, MD         No Known Allergies  Ht 5\' 8"  (1.727 m)   Wt 112.9 kg   LMP 06/15/2020   BMI 37.86 kg/m   Labs: Results for orders placed or performed during the hospital encounter of 07/14/20 (from the past 48 hour(s))  Pregnancy, urine POC     Status: None   Collection Time: 07/14/20 11:04 AM  Result Value Ref Range   Preg Test, Ur NEGATIVE NEGATIVE    Comment:        THE SENSITIVITY OF THIS METHODOLOGY IS >24 mIU/mL     Imaging / Studies: No results found.   [name], M.D., F.A.C.S. Gastrointestinal and Minimally Invasive Surgery [facility] [address], [place], [place] [address] [phone] Main / Paging  07/14/2020 11:14 AM    [name]

## 2020-07-14 NOTE — Discharge Instructions (Signed)
LAPAROSCOPIC SURGERY: POST OP INSTRUCTIONS  ######################################################################  EAT Gradually transition to a high fiber diet with a fiber supplement over the next few weeks after discharge.  Start with a pureed / full liquid diet (see below)  WALK Walk an hour a day.  Control your pain to do that.    CONTROL PAIN Control pain so that you can walk, sleep, tolerate sneezing/coughing, go up/down stairs.  HAVE A BOWEL MOVEMENT DAILY Keep your bowels regular to avoid problems.  OK to try a laxative to override constipation.  OK to use an antidairrheal to slow down diarrhea.  Call if not better after 2 tries  CALL IF YOU HAVE PROBLEMS/CONCERNS Call if you are still struggling despite following these instructions. Call if you have concerns not answered by these instructions  ######################################################################    1. DIET: Follow a light bland diet & liquids the first 24 hours after arrival home, such as soup, liquids, starches, etc.  Be sure to drink plenty of fluids.  Quickly advance to a usual solid diet within a few days.  Avoid fast food or heavy meals as your are more likely to get nauseated or have irregular bowels.  A low-fat, high-fiber diet for the rest of your life is ideal.  2. Take your usually prescribed home medications unless otherwise directed.  3. PAIN CONTROL: a. Pain is best controlled by a usual combination of three different methods TOGETHER: i. Ice/Heat ii. Over the counter pain medication iii. Prescription pain medication b. Most patients will experience some swelling and bruising around the incisions.  Ice packs or heating pads (30-60 minutes up to 6 times a day) will help. Use ice for the first few days to help decrease swelling and bruising, then switch to heat to help relax tight/sore spots and speed recovery.  Some people prefer to use ice alone, heat alone, alternating between ice & heat.   Experiment to what works for you.  Swelling and bruising can take several weeks to resolve.   c. It is helpful to take an over-the-counter pain medication regularly for the first few weeks.  Choose one of the following that works best for you: i. Naproxen (Aleve, etc)  Two 220mg tabs twice a day ii. Ibuprofen (Advil, etc) Three 200mg tabs four times a day (every meal & bedtime) iii. Acetaminophen (Tylenol, etc) 500-650mg four times a day (every meal & bedtime) d. A  prescription for pain medication (such as oxycodone, hydrocodone, tramadol, gabapentin, methocarbamol, etc) should be given to you upon discharge.  Take your pain medication as prescribed.  i. If you are having problems/concerns with the prescription medicine (does not control pain, nausea, vomiting, rash, itching, etc), please call us [phone] to see if we need to switch you to a different pain medicine that will work better for you and/or control your side effect better. ii. If you need a refill on your pain medication, please give us 48 hour notice.  contact your pharmacy.  They will contact our office to request authorization. Prescriptions will not be filled after 5 pm or on week-ends  4. Avoid getting constipated.   a. Between the surgery and the pain medications, it is common to experience some constipation.   b. Increasing fluid intake and taking a fiber supplement (such as Metamucil, Citrucel, FiberCon, MiraLax, etc) 1-2 times a day regularly will usually help prevent this problem from occurring.   c. A mild laxative (prune juice, Milk of Magnesia, MiraLax, etc) should be taken according to   package directions if there are no bowel movements after 48 hours.   5. Watch out for diarrhea.   a. If you have many loose bowel movements, simplify your diet to bland foods & liquids for a few days.   b. Stop any stool softeners and decrease your fiber supplement.   c. Switching to mild anti-diarrheal medications (Kayopectate, Pepto  Bismol) can help.   d. If this worsens or does not improve, please call us.  6. Wash / shower every day.  You may shower over the dressings as they are waterproof.  Continue to shower over incision(s) after the dressing is off.  7. Remove your waterproof bandages 3 days after surgery.  You may leave the incision open to air.  You may replace a dressing/Band-Aid to cover the incision for comfort if you wish.   8. ACTIVITIES as tolerated:   a. You may resume regular (light) daily activities beginning the next day--such as daily self-care, walking, climbing stairs--gradually increasing activities as tolerated.  If you can walk 30 minutes without difficulty, it is safe to try more intense activity such as jogging, treadmill, bicycling, low-impact aerobics, swimming, etc. b. Save the most intensive and strenuous activity for last such as sit-ups, heavy lifting, contact sports, etc  Refrain from any heavy lifting or straining until you are off narcotics for pain control.   c. DO NOT PUSH THROUGH PAIN.  Let pain be your guide: If it hurts to do something, don't do it.  Pain is your body warning you to avoid that activity for another week until the pain goes down. d. You may drive when you are no longer taking prescription pain medication, you can comfortably wear a seatbelt, and you can safely maneuver your car and apply brakes. e. Dennis Bast may have sexual intercourse when it is comfortable.  9. FOLLOW UP in our office a. Please call [facility] at [phone] to set up an appointment to see your surgeon in the office for a follow-up appointment approximately 2-3 weeks after your surgery. b. Make sure that you call for this appointment the day you arrive home to insure a convenient appointment time.  10. IF YOU HAVE DISABILITY OR FAMILY LEAVE FORMS, BRING THEM TO THE OFFICE FOR PROCESSING.  DO NOT GIVE THEM TO YOUR DOCTOR.   WHEN TO CALL us [phone]: 1. Poor pain control 2. Reactions / problems with new  medications (rash/itching, nausea, etc)  3. Fever over 101.5 F (38.5 C) 4. Inability to urinate 5. Nausea and/or vomiting 6. Worsening swelling or bruising 7. Continued bleeding from incision. 8. Increased pain, redness, or drainage from the incision   The clinic staff is available to answer your questions during regular business hours (8:30am-5pm).  Please don't hesitate to call and ask to speak to one of our nurses for clinical concerns.   If you have a medical emergency, go to the nearest emergency room or call 911.  A surgeon from [facility] is always on call at the [facility], [place], [place], [place], [place]  [address] ? MAIN: [phone] ? TOLL FREE: [phone] ?  FAX [phone] [email or web address]  No advil, aleve, motrin, ibuprofen until 530 pm today   Post Anesthesia Home Care Instructions  Activity: Get plenty of rest for the remainder of the day. A responsible adult should stay with you for 24 hours following the procedure.  For the next 24 hours, DO NOT: -Drive a car -  Operate machinery -Drink alcoholic beverages -Take any medication unless instructed by your physician -Make any legal decisions or sign important papers.  Meals: Start with liquid foods such as gelatin or soup. Progress to regular foods as tolerated. Avoid greasy, spicy, heavy foods. If nausea and/or vomiting occur, drink only clear liquids until the nausea and/or vomiting subsides. Call your physician if vomiting continues.  Special Instructions/Symptoms: Your throat may feel dry or sore from the anesthesia or the breathing tube placed in your throat during surgery. If this causes discomfort, gargle with warm salt water. The discomfort should disappear within 24 hours.  If you had a scopolamine patch placed behind your ear for the management of post- operative nausea and/or vomiting:  1. The medication in the patch is  effective for 72 hours, after which it should be removed.  Wrap patch in a tissue and discard in the trash. Wash hands thoroughly with soap and water. 2. You may remove the patch earlier than 72 hours if you experience unpleasant side effects which may include dry mouth, dizziness or visual disturbances. 3. Avoid touching the patch. Wash your hands with soap and water after contact with the patch.   Information for Discharge Teaching: EXPAREL (bupivacaine liposome injectable suspension)   Your surgeon gave you EXPAREL(bupivacaine) in your surgical incision to help control your pain after surgery.   EXPAREL is a local anesthetic that provides pain relief by numbing the tissue around the surgical site.  EXPAREL is designed to release pain medication over time and can control pain for up to 72 hours.  Depending on how you respond to EXPAREL, you may require less pain medication during your recovery.  Possible side effects:  Temporary loss of sensation or ability to move in the area where bupivacaine was injected.  Nausea, vomiting, constipation  Rarely, numbness and tingling in your mouth or lips, lightheadedness, or anxiety may occur.  Call your doctor right away if you think you may be experiencing any of these sensations, or if you have other questions regarding possible side effects.  Follow all other discharge instructions given to you by your surgeon or nurse. Eat a healthy diet and drink plenty of water or other fluids.  If you return to the hospital for any reason within 96 hours following the administration of EXPAREL, please inform your health care providers.

## 2020-07-14 NOTE — Anesthesia Postprocedure Evaluation (Signed)
Anesthesia Post Note  Patient: Rhonda Le  Procedure(s) Performed: LAPAROSCOPIC CHOLECYSTECTOMY SINGLE SITE WITH INTRAOPERATIVE CHOLANGIOGRAM (N/A Abdomen)     Patient location during evaluation: PACU Anesthesia Type: General Level of consciousness: awake and alert Pain management: pain level controlled Vital Signs Assessment: post-procedure vital signs reviewed and stable Respiratory status: spontaneous breathing, nonlabored ventilation, respiratory function stable and patient connected to nasal cannula oxygen Cardiovascular status: blood pressure returned to baseline and stable Postop Assessment: no apparent nausea or vomiting Anesthetic complications: no   No complications documented.  Last Vitals:  Vitals:   07/14/20 1430 07/14/20 1445  BP: (!) 171/97 (!) 165/90  Pulse: 90 80  Resp: (!) 33 (!) 26  Temp:    SpO2: 100% 100%    Last Pain:  Vitals:   07/14/20 1430  TempSrc:   PainSc: 8                  Daman Steffenhagen S

## 2020-07-14 NOTE — Transfer of Care (Signed)
Immediate Anesthesia Transfer of Care Note  Patient: Rhonda Le  Procedure(s) Performed: LAPAROSCOPIC CHOLECYSTECTOMY SINGLE SITE WITH INTRAOPERATIVE CHOLANGIOGRAM (N/A Abdomen)  Patient Location: PACU  Anesthesia Type:General  Level of Consciousness: awake, alert , oriented and patient cooperative  Airway & Oxygen Therapy: Patient Spontanous Breathing and Patient connected to face mask oxygen  Post-op Assessment: Report given to RN and Post -op Vital signs reviewed and stable  Post vital signs: Reviewed and stable  Last Vitals:  Vitals Value Taken Time  BP 164/92 07/14/20 1409  Temp    Pulse 90 07/14/20 1411  Resp 26 07/14/20 1411  SpO2 100 % 07/14/20 1411  Vitals shown include unvalidated device data.  Last Pain:  Vitals:   07/14/20 1126  TempSrc: Oral  PainSc: 0-No pain      Patients Stated Pain Goal: 6 (60/67/70 3403)  Complications: No complications documented.

## 2020-07-15 ENCOUNTER — Encounter (HOSPITAL_BASED_OUTPATIENT_CLINIC_OR_DEPARTMENT_OTHER): Payer: Self-pay | Admitting: Surgery

## 2020-07-15 LAB — SURGICAL PATHOLOGY

## 2020-07-22 ENCOUNTER — Other Ambulatory Visit (HOSPITAL_BASED_OUTPATIENT_CLINIC_OR_DEPARTMENT_OTHER): Payer: Self-pay | Admitting: Family Medicine

## 2020-07-22 DIAGNOSIS — Z1231 Encounter for screening mammogram for malignant neoplasm of breast: Secondary | ICD-10-CM

## 2020-07-26 ENCOUNTER — Ambulatory Visit (HOSPITAL_BASED_OUTPATIENT_CLINIC_OR_DEPARTMENT_OTHER)
Admission: RE | Admit: 2020-07-26 | Discharge: 2020-07-26 | Disposition: A | Discharge status: Home / Self Care | Payer: BC Managed Care – PPO | Source: Ambulatory Visit | Attending: Family Medicine | Admitting: Family Medicine

## 2020-07-26 ENCOUNTER — Other Ambulatory Visit: Payer: Self-pay

## 2020-07-26 ENCOUNTER — Encounter (HOSPITAL_BASED_OUTPATIENT_CLINIC_OR_DEPARTMENT_OTHER): Payer: Self-pay

## 2020-07-26 DIAGNOSIS — Z1231 Encounter for screening mammogram for malignant neoplasm of breast: Secondary | ICD-10-CM | POA: Diagnosis present

## 2020-08-09 ENCOUNTER — Ambulatory Visit (INDEPENDENT_AMBULATORY_CARE_PROVIDER_SITE_OTHER): Payer: BC Managed Care – PPO | Admitting: Family Medicine

## 2020-08-13 ENCOUNTER — Other Ambulatory Visit (HOSPITAL_COMMUNITY): Payer: BC Managed Care – PPO

## 2020-08-30 ENCOUNTER — Other Ambulatory Visit: Payer: Self-pay | Admitting: Family Medicine

## 2020-10-03 ENCOUNTER — Ambulatory Visit: Payer: BC Managed Care – PPO | Attending: Internal Medicine

## 2020-10-03 DIAGNOSIS — Z23 Encounter for immunization: Secondary | ICD-10-CM

## 2020-10-03 NOTE — Progress Notes (Signed)
   Covid-19 Vaccination Clinic  Name:  Lana Flaim    MRN: [id number] DOB: 1975/10/30  10/03/2020  Ms. Cuttino was observed post Covid-19 immunization for 15 minutes without incident. She was provided with Vaccine Information Sheet and instruction to access the V-Safe system.   Ms. Mcmonigle was instructed to call 911 with any severe reactions post vaccine: Marland Kitchen Difficulty breathing  . Swelling of face and throat  . A fast heartbeat  . A bad rash all over body  . Dizziness and weakness   Immunizations Administered    Name Date Dose VIS Date Route   Moderna Covid-19 Booster Vaccine 10/03/2020  2:17 PM 0.25 mL 08/03/2020 Intramuscular   Manufacturer: Levan Hurst   Lot: [id number]   [place]: [id number]

## 2020-11-15 LAB — HM DIABETES EYE EXAM

## 2020-12-17 ENCOUNTER — Other Ambulatory Visit: Payer: Self-pay | Admitting: Family Medicine

## 2021-01-14 ENCOUNTER — Other Ambulatory Visit: Payer: Self-pay | Admitting: Family Medicine

## 2021-02-07 IMAGING — RF DG CHOLANGIOGRAM OPERATIVE
1 series · 4 of 4 positions shown · non-contrast
Comparison: Right upper quadrant abdominal ultrasound-06/14/2020

CLINICAL DATA: Intraoperative cholangiogram during laparoscopic
cholecystectomy.

EXAM:
INTRAOPERATIVE CHOLANGIOGRAM
FLUOROSCOPY TIME:  15 seconds

[Series 1: run · 4 of 79 frames shown]
[frame 12/79]
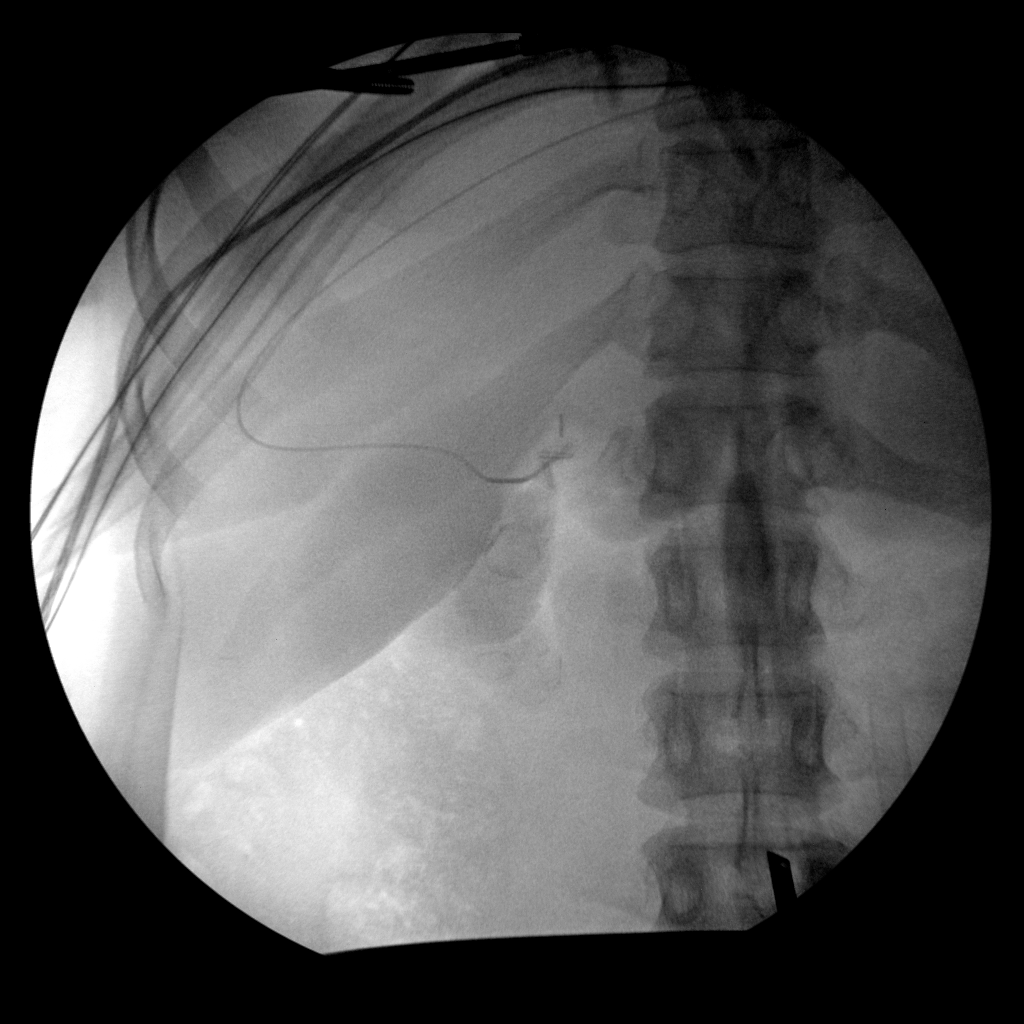
[frame 40/79]
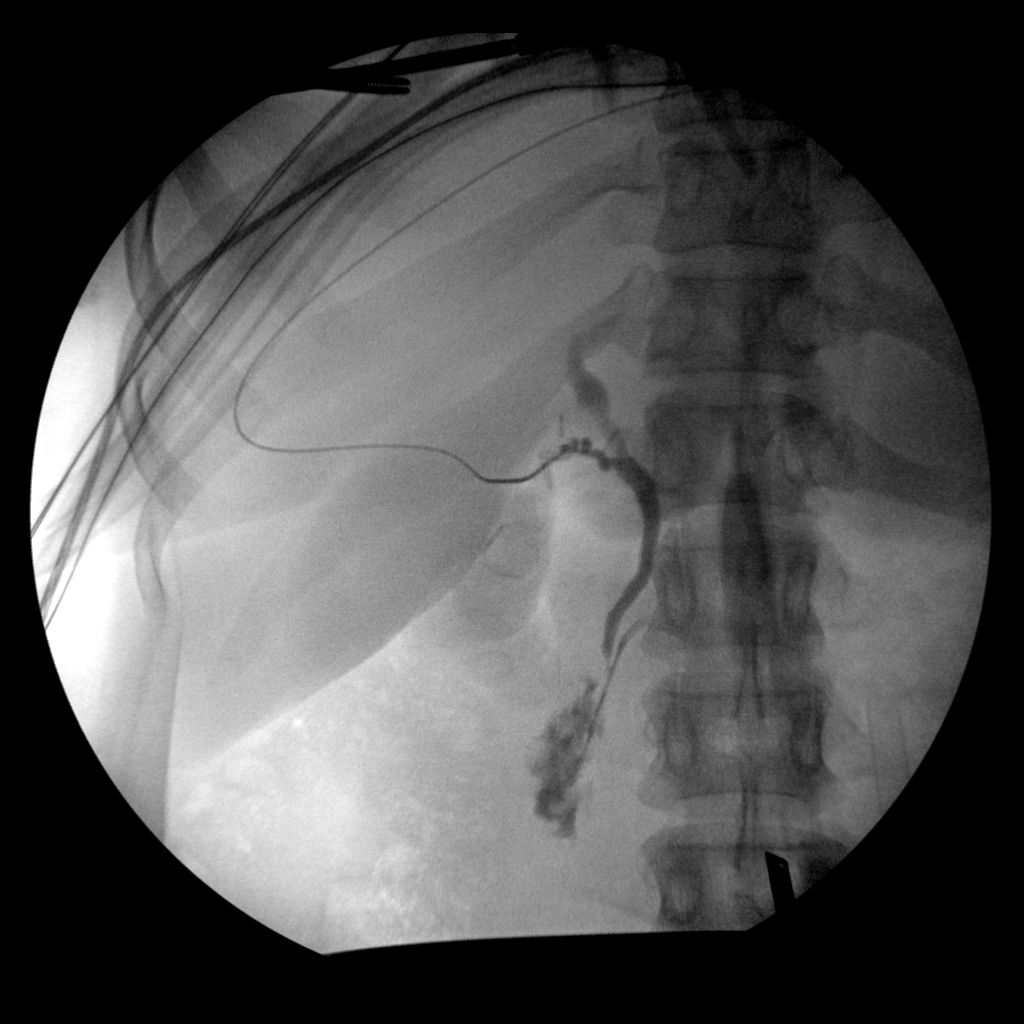
[frame 68/79]
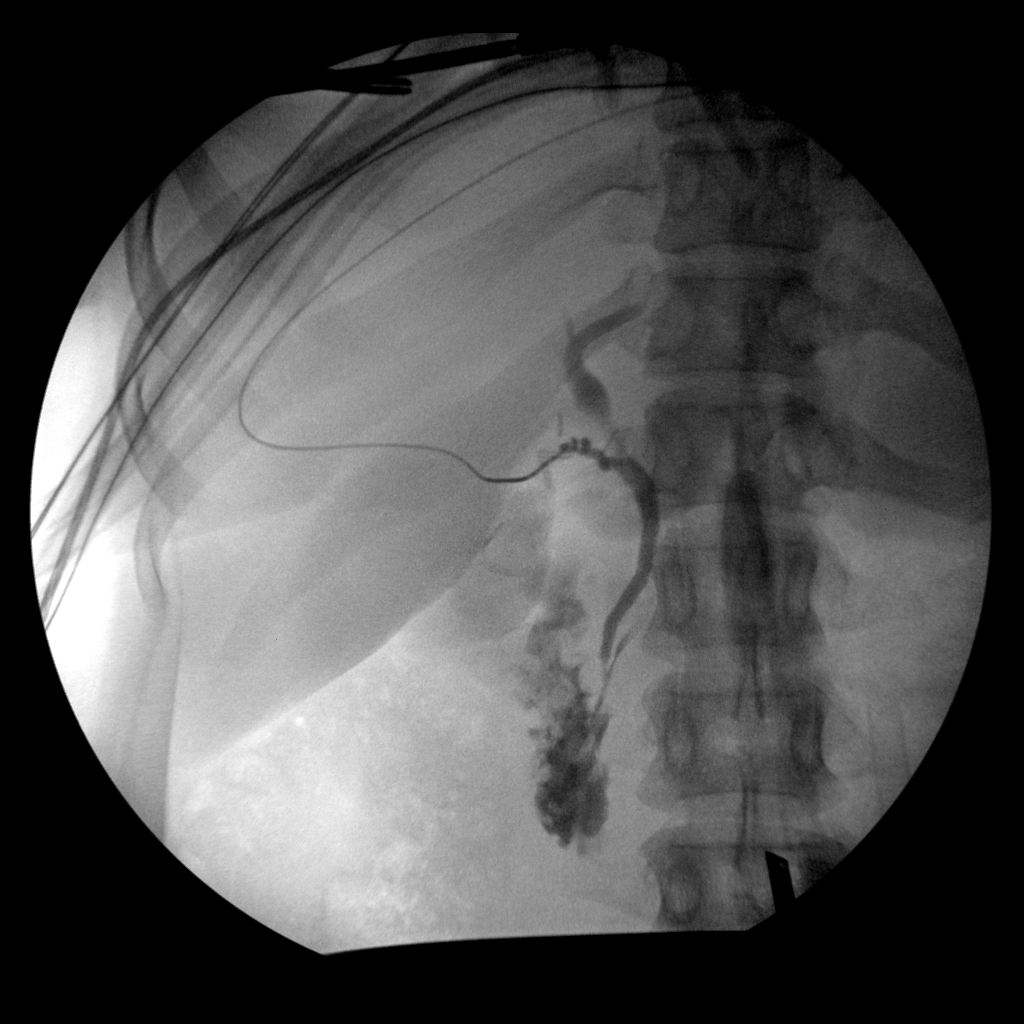
[frame 76/79]
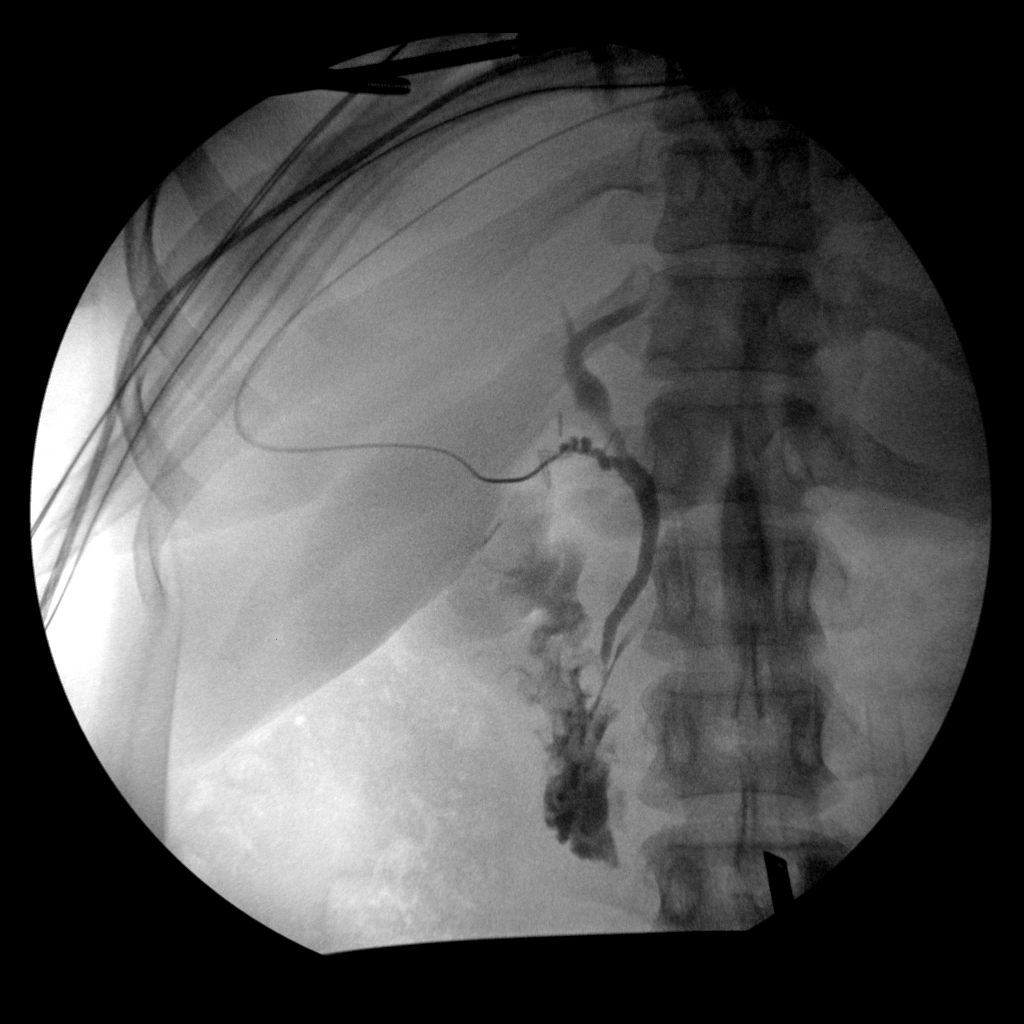

[4 of 4 positions shown; findings below may reference images not displayed]

FINDINGS: Intraoperative cholangiographic images of the right upper abdominal
quadrant during laparoscopic cholecystectomy are provided for
review.

Surgical clips overlie the expected location of the gallbladder
fossa.

Contrast injection demonstrates selective cannulation of the central
aspect of the cystic duct.

There is passage of contrast through the central aspect of the
cystic duct with filling of a non dilated common bile duct. There is
passage of contrast though the CBD and into the descending portion
of the duodenum.

There is minimal reflux of injected contrast into the common hepatic
duct and central aspect of the non dilated intrahepatic biliary
system. There is minimal opacification the central aspect the
pancreatic duct which appears nondilated.

There are no discrete filling defects within the opacified portions
of the biliary system to suggest the presence of
choledocholithiasis.
IMPRESSION: No evidence of choledocholithiasis.

## 2021-02-13 ENCOUNTER — Other Ambulatory Visit: Payer: Self-pay | Admitting: Family Medicine

## 2021-02-22 ENCOUNTER — Other Ambulatory Visit: Payer: Self-pay | Admitting: Family Medicine

## 2021-02-22 ENCOUNTER — Telehealth: Payer: Self-pay | Admitting: Family Medicine

## 2021-02-22 DIAGNOSIS — E1169 Type 2 diabetes mellitus with other specified complication: Secondary | ICD-10-CM

## 2021-02-22 DIAGNOSIS — Z0000 Encounter for general adult medical examination without abnormal findings: Secondary | ICD-10-CM

## 2021-02-22 DIAGNOSIS — E559 Vitamin D deficiency, unspecified: Secondary | ICD-10-CM

## 2021-02-22 DIAGNOSIS — I10 Essential (primary) hypertension: Secondary | ICD-10-CM

## 2021-02-22 NOTE — Telephone Encounter (Signed)
I had to reschedule her physical for this Friday to 03/10/21 for her physical. She would like to come in before to get labs if ok//insurance? Let me know if ok can order/schedule lab appt.

## 2021-02-22 NOTE — Progress Notes (Signed)
cmp

## 2021-02-22 NOTE — Telephone Encounter (Signed)
Patient called Lab appt. Scheduled Order entered.

## 2021-02-24 ENCOUNTER — Encounter: Payer: BC Managed Care – PPO | Admitting: Family Medicine

## 2021-03-03 ENCOUNTER — Other Ambulatory Visit: Payer: Self-pay

## 2021-03-03 ENCOUNTER — Other Ambulatory Visit: Payer: Self-pay | Admitting: Family Medicine

## 2021-03-03 ENCOUNTER — Other Ambulatory Visit (INDEPENDENT_AMBULATORY_CARE_PROVIDER_SITE_OTHER): Payer: BC Managed Care – PPO

## 2021-03-03 DIAGNOSIS — E559 Vitamin D deficiency, unspecified: Secondary | ICD-10-CM

## 2021-03-03 DIAGNOSIS — Z0000 Encounter for general adult medical examination without abnormal findings: Secondary | ICD-10-CM

## 2021-03-03 DIAGNOSIS — E1169 Type 2 diabetes mellitus with other specified complication: Secondary | ICD-10-CM

## 2021-03-03 DIAGNOSIS — I10 Essential (primary) hypertension: Secondary | ICD-10-CM

## 2021-03-03 LAB — CBC
HCT: 34.7 % — ABNORMAL LOW (ref 36.0–46.0)
Hemoglobin: 11.4 g/dL — ABNORMAL LOW (ref 12.0–15.0)
MCHC: 32.7 g/dL (ref 30.0–36.0)
MCV: 87.5 fl (ref 78.0–100.0)
Platelets: 274 10*3/uL (ref 150.0–400.0)
RBC: 3.97 Mil/uL (ref 3.87–5.11)
RDW: 14.7 % (ref 11.5–15.5)
WBC: 6.7 10*3/uL (ref 4.0–10.5)

## 2021-03-03 LAB — COMPREHENSIVE METABOLIC PANEL
ALT: 35 U/L (ref 0–35)
AST: 34 U/L (ref 0–37)
Albumin: 4.3 g/dL (ref 3.5–5.2)
Alkaline Phosphatase: 50 U/L (ref 39–117)
BUN: 12 mg/dL (ref 6–23)
CO2: 24 mEq/L (ref 19–32)
Calcium: 9.6 mg/dL (ref 8.4–10.5)
Chloride: 105 mEq/L (ref 96–112)
Creatinine, Ser: 0.88 mg/dL (ref 0.40–1.20)
GFR: 78.35 mL/min (ref >60.00)
Glucose, Bld: 127 mg/dL — ABNORMAL HIGH (ref 70–99)
Potassium: 4.6 mEq/L (ref 3.5–5.1)
Sodium: 138 mEq/L (ref 135–145)
Total Bilirubin: 0.4 mg/dL (ref 0.2–1.2)
Total Protein: 7.1 g/dL (ref 6.0–8.3)

## 2021-03-03 LAB — MICROALBUMIN / CREATININE URINE RATIO
Creatinine,U: 119.4 mg/dL
Microalb Creat Ratio: 0.6 mg/g (ref 0.0–30.0)
Microalb, Ur: <0.7 mg/dL (ref 0.0–1.9)

## 2021-03-03 LAB — VITAMIN D 25 HYDROXY (VIT D DEFICIENCY, FRACTURES): VITD: 15.89 ng/mL — ABNORMAL LOW (ref 30.00–100.00)

## 2021-03-03 LAB — HEMOGLOBIN A1C: Hgb A1c MFr Bld: 7.9 % — ABNORMAL HIGH (ref 4.6–6.5)

## 2021-03-03 MED ORDER — VITAMIN D (ERGOCALCIFEROL) 1.25 MG (50000 UNIT) PO CAPS: 50000.0000 [IU] | ORAL_CAPSULE | ORAL | 0 refills | Status: DC

## 2021-03-06 ENCOUNTER — Other Ambulatory Visit: Payer: BC Managed Care – PPO

## 2021-03-06 ENCOUNTER — Other Ambulatory Visit: Payer: Self-pay | Admitting: *Deleted

## 2021-03-06 DIAGNOSIS — D649 Anemia, unspecified: Secondary | ICD-10-CM

## 2021-03-06 MED ORDER — VITAMIN D (ERGOCALCIFEROL) 1.25 MG (50000 UNIT) PO CAPS
50000.0000 [IU] | ORAL_CAPSULE | ORAL | 0 refills | Status: DC
Start: 2021-03-06 — End: 2021-05-19

## 2021-03-07 LAB — IRON,TIBC AND FERRITIN PANEL
%SAT: 11 % (calc) — ABNORMAL LOW (ref 16–45)
Ferritin: 19 ng/mL (ref 16–232)
Iron: 40 ug/dL (ref 40–190)
TIBC: 376 mcg/dL (calc) (ref 250–450)

## 2021-03-10 ENCOUNTER — Encounter: Payer: Self-pay | Admitting: Family Medicine

## 2021-03-10 ENCOUNTER — Other Ambulatory Visit: Payer: Self-pay

## 2021-03-10 ENCOUNTER — Ambulatory Visit (INDEPENDENT_AMBULATORY_CARE_PROVIDER_SITE_OTHER): Payer: BC Managed Care – PPO | Admitting: Family Medicine

## 2021-03-10 VITALS — BP 122/80 | HR 88 | Temp 99.1°F | Ht 68.0 in | Wt 247.0 lb

## 2021-03-10 DIAGNOSIS — Z0000 Encounter for general adult medical examination without abnormal findings: Secondary | ICD-10-CM | POA: Diagnosis not present

## 2021-03-10 DIAGNOSIS — E669 Obesity, unspecified: Secondary | ICD-10-CM | POA: Diagnosis not present

## 2021-03-10 DIAGNOSIS — E1169 Type 2 diabetes mellitus with other specified complication: Secondary | ICD-10-CM

## 2021-03-10 DIAGNOSIS — Z1211 Encounter for screening for malignant neoplasm of colon: Secondary | ICD-10-CM | POA: Diagnosis not present

## 2021-03-10 DIAGNOSIS — Z6838 Body mass index (BMI) 38.0-38.9, adult: Secondary | ICD-10-CM

## 2021-03-10 MED ORDER — OZEMPIC (0.25 OR 0.5 MG/DOSE) 2 MG/1.5ML ~~LOC~~ SOPN: PEN_INJECTOR | SUBCUTANEOUS | 1 refills | Status: DC

## 2021-03-10 MED ORDER — ATORVASTATIN CALCIUM 40 MG PO TABS: 1.0000 | ORAL_TABLET | Freq: Every day | ORAL | 1 refills | Status: DC

## 2021-03-10 NOTE — Progress Notes (Signed)
Chief Complaint  Patient presents with  . Annual Exam     Well Woman Rhonda Le is here for a complete physical.   Her last physical was >1 year ago.  Current diet: in general, a "healthy" diet. Current exercise: cardio, wt lifting. Weight is stable and she denies fatigue out of ordinary. No LMP recorded..  Seatbelt? Yes Loss of interested in doing things or depression in past 2 weeks? No  Health Maintenance Pap/HPV- Yes Tetanus- Yes Hep C screening- Yes HIV screening- Yes   Diabetes The patient had 3 physical labs done and A1c was 7.9.  She is compliant with metformin 500 mg twice daily.  She is very frustrated with her inability to lose weight and sometimes gained weight.  She is stopped taking her Lipitor.  She is up-to-date with her eye exam.  Past Medical History:  Diagnosis Date  . Anemia    iron pill now  . Biliary colic   . Constipation   . High cholesterol   . Hypertension   . Morbid obesity ([place])   . TIA (transient ischemic attack) 2019   caused by extreme stress, no residual  . Type 2 diabetes mellitus ([place])      Past Surgical History:  Procedure Laterality Date  . BREAST REDUCTION SURGERY Bilateral 2004  . LAPAROSCOPIC CHOLECYSTECTOMY SINGLE SITE WITH INTRAOPERATIVE CHOLANGIOGRAM N/A 07/14/2020   Procedure: LAPAROSCOPIC CHOLECYSTECTOMY SINGLE SITE WITH INTRAOPERATIVE CHOLANGIOGRAM;  Surgeon: Michael Boston, MD;  Location: [facility];  Service: General;  Laterality: N/A;  . REDUCTION MAMMAPLASTY    . TUBAL LIGATION  2003    Medications  Current Outpatient Medications on File Prior to Visit  Medication Sig Dispense Refill  . atorvastatin (LIPITOR) 40 MG tablet TAKE 1 TABLET BY MOUTH EVERY DAY (Patient taking differently: daily.) 90 tablet 1  . Black Cohosh 40 MG CAPS Take by mouth daily.    Marland Kitchen losartan (COZAAR) 100 MG tablet TAKE 1 TABLET BY MOUTH EVERY DAY *NEED APPT* 30 tablet 0  . metFORMIN (GLUCOPHAGE) 1000 MG tablet TAKE 0.5  TABLETS (500 MG TOTAL) BY MOUTH 2 (TWO) TIMES DAILY WITH A MEAL. 180 tablet 1  . Vitamin D, Ergocalciferol, (DRISDOL) 1.25 MG (50000 UNIT) CAPS capsule Take 1 capsule (50,000 Units total) by mouth every 7 (seven) days. 12 capsule 0  . dicyclomine (BENTYL) 10 MG capsule TAKE 1 TAB EVERY 6 HOURS AS NEEDED FOR ABDOMINAL CRAMPING. 360 capsule 1  . ferrous sulfate (FER-IN-SOL) 75 (15 Fe) MG/ML SOLN Take 15 mg of iron by mouth daily. Iron liquid 2 x week      Allergies No Known Allergies  Review of Systems: Constitutional:  no unexpected weight changes Eye:  no recent significant change in vision Ear/Nose/Mouth/Throat:  Ears:  no recent change in hearing Nose/Mouth/Throat:  no complaints of nasal congestion, no sore throat Cardiovascular: no chest pain Respiratory:  no shortness of breath Gastrointestinal:  no abdominal pain, no change in bowel habits GU:  Female: negative for dysuria or pelvic pain Musculoskeletal/Extremities:  no pain of the joints Integumentary (Skin/Breast):  no abnormal skin lesions reported Neurologic:  no headaches Endocrine:  denies fatigue Hematologic/Lymphatic:  No areas of easy bleeding  Exam BP 122/80 (BP Location: Left Arm, Patient Position: Sitting, Cuff Size: Large)   Pulse 88   Temp 99.1 F (37.3 C) (Oral)   Ht 5\' 8"  (1.727 m)   Wt 247 lb (112 kg)   SpO2 99%   BMI 37.56 kg/m  General:  well  developed, well nourished, in no apparent distress Skin:  no significant moles, warts, or growths Head:  no masses, lesions, or tenderness Eyes:  pupils equal and round, sclera anicteric without injection Ears:  canals without lesions, TMs shiny without retraction, no obvious effusion, no erythema Nose:  nares patent, septum midline, mucosa normal, and no drainage or sinus tenderness Throat/Pharynx:  lips and gingiva without lesion; tongue and uvula midline; non-inflamed pharynx; no exudates or postnasal drainage Neck: neck supple without adenopathy, thyromegaly,  or masses Lungs:  clear to auscultation, breath sounds equal bilaterally, no respiratory distress Cardio:  regular rate and rhythm, no LE edema Abdomen:  abdomen soft, nontender; bowel sounds normal; no masses or organomegaly Genital: Defer to GYN Musculoskeletal:  symmetrical muscle groups noted without atrophy or deformity Extremities:  no clubbing, cyanosis, or edema, no deformities, no skin discoloration Neuro:  gait normal; deep tendon reflexes normal and symmetric Psych: well oriented with normal range of affect and appropriate judgment/insight  Assessment and Plan  Well adult exam  Type 2 diabetes mellitus with obesity ([place]), Chronic - Plan: Semaglutide,0.25 or 0.5MG /DOS, (OZEMPIC, 0.25 OR 0.5 MG/DOSE,) 2 MG/1.5ML SOPN, atorvastatin (LIPITOR) 40 MG tablet  Class 2 severe obesity due to excess calories with serious comorbidity and body mass index (BMI) of 38.0 to 38.9 in adult [facility]), Chronic  Screen for colon cancer - Plan: Ambulatory referral to Gastroenterology   Well 45 y.o. female. Counseled on diet and exercise. Other orders as above. Diabetes: Chronic, uncontrolled.  Add Ozempic to metformin 500 mg twice daily.  She will take weekly injections of 0.25 mg weekly for 4 doses and then increase to 0.5 mg weekly.  This should help with weight loss also.  I did bring up bariatric surgery which she is not interested in at this time.  Restart Lipitor. Follow up in 3 mo. The patient voiced understanding and agreement to the plan.  [place], DO 03/10/21 12:07 PM

## 2021-03-10 NOTE — Patient Instructions (Addendum)
Keep the diet clean and stay active.  Let me know if there are cost issues and don't fill it.   If you do not hear anything about your referral in the next 1-2 weeks, call our office and ask for an update.  Let us know if you need anything.

## 2021-03-14 ENCOUNTER — Other Ambulatory Visit: Payer: Self-pay | Admitting: Family Medicine

## 2021-03-17 DIAGNOSIS — E669 Obesity, unspecified: Secondary | ICD-10-CM

## 2021-03-17 DIAGNOSIS — E1169 Type 2 diabetes mellitus with other specified complication: Secondary | ICD-10-CM

## 2021-03-17 MED ORDER — OZEMPIC (0.25 OR 0.5 MG/DOSE) 2 MG/1.5ML ~~LOC~~ SOPN: PEN_INJECTOR | SUBCUTANEOUS | 1 refills | Status: DC

## 2021-04-10 ENCOUNTER — Other Ambulatory Visit: Payer: Self-pay | Admitting: Family Medicine

## 2021-04-12 ENCOUNTER — Other Ambulatory Visit: Payer: Self-pay

## 2021-04-12 ENCOUNTER — Ambulatory Visit (AMBULATORY_SURGERY_CENTER): Payer: BC Managed Care – PPO

## 2021-04-12 VITALS — Ht 68.0 in | Wt 247.0 lb

## 2021-04-12 DIAGNOSIS — Z1211 Encounter for screening for malignant neoplasm of colon: Secondary | ICD-10-CM

## 2021-04-12 MED ORDER — GOLYTELY 236 G PO SOLR: 4000.0000 mL | ORAL | 0 refills | Status: DC

## 2021-04-12 NOTE — Progress Notes (Signed)
Pre visit completed via phone call; patient verified name, DOB, and address;  No egg or soy allergy known to patient  No issues with past sedation with any surgeries or procedures Patient denies ever being told they had issues or difficulty with intubation  No FH of Malignant Hyperthermia No diet pills per patient No home 02 use per patient  No blood thinners per patient  Pt denies issues with constipation at this; No A fib or A flutter  EMMI video via [place] 19 guidelines implemented in PV today with Pt and RN  Pt is fully vaccinated for Covid   NO PA's for preps discussed with pt in PV today  Discussed with pt there will be an out-of-pocket cost for prep and that varies from $0 to 70 dollars   Due to the COVID-19 pandemic we are asking patients to follow certain guidelines.  Pt aware of COVID protocols and [facility] guidelines    Patient advised to stop IRON 5 days prior to procedure, hand written on instructions prior to mailing;

## 2021-05-02 ENCOUNTER — Encounter: Payer: Self-pay | Admitting: Gastroenterology

## 2021-05-02 ENCOUNTER — Other Ambulatory Visit: Payer: Self-pay

## 2021-05-02 ENCOUNTER — Ambulatory Visit (AMBULATORY_SURGERY_CENTER): Payer: BC Managed Care – PPO | Admitting: Gastroenterology

## 2021-05-02 VITALS — BP 122/80 | HR 73 | Temp 97.7°F | Resp 26 | Ht 68.0 in | Wt 247.0 lb

## 2021-05-02 DIAGNOSIS — Z1211 Encounter for screening for malignant neoplasm of colon: Secondary | ICD-10-CM | POA: Diagnosis not present

## 2021-05-02 MED ORDER — SODIUM CHLORIDE 0.9 % IV SOLN: 500.0000 mL | Freq: Once | INTRAVENOUS | Status: DC

## 2021-05-02 NOTE — Op Note (Signed)
[place] [place] Patient Name: Rhonda Le Procedure Date: 05/02/2021 7:33 AM MRN: [id number] Endoscopist: Gerrit Heck , MD Age: 45 Referring MD:  Date of Birth: 09-14-1976 Gender: Female Account #: [id number] Procedure:                Colonoscopy Indications:              Screening for colorectal malignant neoplasm, This                            is the patient's first colonoscopy Medicines:                Monitored Anesthesia Care Procedure:                Pre-Anesthesia Assessment:                           - Prior to the procedure, a History and Physical                            was performed, and patient medications and                            allergies were reviewed. The patient's tolerance of                            previous anesthesia was also reviewed. The risks                            and benefits of the procedure and the sedation                            options and risks were discussed with the patient.                            All questions were answered, and informed consent                            was obtained. Prior Anticoagulants: The patient has                            taken no previous anticoagulant or antiplatelet                            agents. ASA Grade Assessment: III - A patient with                            severe systemic disease. After reviewing the risks                            and benefits, the patient was deemed in                            satisfactory condition to undergo the procedure.  After obtaining informed consent, the colonoscope                            was passed under direct vision. Throughout the                            procedure, the patient's blood pressure, pulse, and                            oxygen saturations were monitored continuously. The                            CF [id number] #[id number] was introduced through the anus                            and advanced  to the the cecum, identified by                            appendiceal orifice and ileocecal valve. The                            colonoscopy was performed without difficulty. The                            patient tolerated the procedure well. The quality                            of the bowel preparation was excellent. The                            ileocecal valve, appendiceal orifice, and rectum                            were photographed. Scope In: 8:06:36 AM Scope Out: 8:23:09 AM Scope Withdrawal Time: 0 hours 13 minutes 23 seconds  Total Procedure Duration: 0 hours 16 minutes 33 seconds  Findings:                 Skin tags were found on perianal exam.                           The colon appeared normal.                           The retroflexed view of the distal rectum and anal                            verge was normal and showed no anal or rectal                            abnormalities. Complications:            No immediate complications. Estimated Blood Loss:     Estimated blood loss: none. Impression:               - Perianal skin tags  found on perianal exam.                           - The entire examined colon is normal.                           - The distal rectum and anal verge are normal on                            retroflexion view.                           - No specimens collected. Recommendation:           - Patient has a contact number available for                            emergencies. The signs and symptoms of potential                            delayed complications were discussed with the                            patient. Return to normal activities tomorrow.                            Written discharge instructions were provided to the                            patient.                           - Resume previous diet.                           - Continue present medications.                           - Repeat colonoscopy in 10 years for  screening                            purposes.                           - Return to GI office PRN. Gerrit Heck, MD 05/02/2021 8:27:43 AM

## 2021-05-02 NOTE — Patient Instructions (Signed)
No polyps!!!  Next colonoscopy in 10 years  Please continue your normal medications  Follow up with Dr. Bryan Lemma in the office as needed  YOU HAD AN ENDOSCOPIC PROCEDURE TODAY AT [facility]:   Refer to the procedure report that was given to you for any specific questions about what was found during the examination.  If the procedure report does not answer your questions, please call your gastroenterologist to clarify.  If you requested that your care partner not be given the details of your procedure findings, then the procedure report has been included in a sealed envelope for you to review at your convenience later.  YOU SHOULD EXPECT: Some feelings of bloating in the abdomen. Passage of more gas than usual.  Walking can help get rid of the air that was put into your GI tract during the procedure and reduce the bloating. If you had a lower endoscopy (such as a colonoscopy or flexible sigmoidoscopy) you may notice spotting of blood in your stool or on the toilet paper. If you underwent a bowel prep for your procedure, you may not have a normal bowel movement for a few days.  Please Note:  You might notice some irritation and congestion in your nose or some drainage.  This is from the oxygen used during your procedure.  There is no need for concern and it should clear up in a day or so.  SYMPTOMS TO REPORT IMMEDIATELY:  Following lower endoscopy (colonoscopy or flexible sigmoidoscopy):  Excessive amounts of blood in the stool  Significant tenderness or worsening of abdominal pains  Swelling of the abdomen that is new, acute  Fever of 100F or higher  For urgent or emergent issues, a gastroenterologist can be reached at any hour by calling [phone]. Do not use MyChart messaging for urgent concerns.    DIET:  We do recommend a small meal at first, but then you may proceed to your regular diet.  Drink plenty of fluids but you should avoid alcoholic beverages for 24  hours.  ACTIVITY:  You should plan to take it easy for the rest of today and you should NOT DRIVE or use heavy machinery until tomorrow (because of the sedation medicines used during the test).    FOLLOW UP: Our staff will call the number listed on your records 48-72 hours following your procedure to check on you and address any questions or concerns that you may have regarding the information given to you following your procedure. If we do not reach you, we will leave a message.  We will attempt to reach you two times.  During this call, we will ask if you have developed any symptoms of COVID 19. If you develop any symptoms (ie: fever, flu-like symptoms, shortness of breath, cough etc.) before then, please call [phone].  If you test positive for Covid 19 in the 2 weeks post procedure, please call and report this information to [place].    SIGNATURES/CONFIDENTIALITY: You and/or your care partner have signed paperwork which will be entered into your electronic medical record.  These signatures attest to the fact that that the information above on your After Visit Summary has been reviewed and is understood.  Full responsibility of the confidentiality of this discharge information lies with you and/or your care-partner.

## 2021-05-02 NOTE — Progress Notes (Signed)
Pt's states no medical or surgical changes since previsit or office visit.  ° °Vitals CW °

## 2021-05-02 NOTE — Progress Notes (Signed)
pt tolerated well. VSS. awake and to recovery. Report given to RN.  

## 2021-05-04 ENCOUNTER — Telehealth: Payer: Self-pay

## 2021-05-04 NOTE — Telephone Encounter (Signed)
Second attempt follow up call to pt, lm on vm. 

## 2021-05-04 NOTE — Telephone Encounter (Signed)
First attempt follow up call to pt, No answer.

## 2021-05-07 ENCOUNTER — Other Ambulatory Visit: Payer: Self-pay | Admitting: Family Medicine

## 2021-05-08 ENCOUNTER — Ambulatory Visit (INDEPENDENT_AMBULATORY_CARE_PROVIDER_SITE_OTHER): Payer: BC Managed Care – PPO | Admitting: Nurse Practitioner

## 2021-05-08 ENCOUNTER — Other Ambulatory Visit: Payer: Self-pay

## 2021-05-08 ENCOUNTER — Other Ambulatory Visit (HOSPITAL_COMMUNITY)
Admission: RE | Admit: 2021-05-08 | Discharge: 2021-05-08 | Disposition: A | Discharge status: Home / Self Care | Payer: BC Managed Care – PPO | Source: Ambulatory Visit | Attending: Nurse Practitioner | Admitting: Nurse Practitioner

## 2021-05-08 ENCOUNTER — Encounter: Payer: Self-pay | Admitting: Nurse Practitioner

## 2021-05-08 VITALS — BP 136/74 | Ht 67.0 in | Wt 246.0 lb

## 2021-05-08 DIAGNOSIS — N951 Menopausal and female climacteric states: Secondary | ICD-10-CM | POA: Diagnosis not present

## 2021-05-08 DIAGNOSIS — Z01419 Encounter for gynecological examination (general) (routine) without abnormal findings: Secondary | ICD-10-CM | POA: Insufficient documentation

## 2021-05-08 NOTE — Progress Notes (Signed)
   Rhonda Le Jul 15, 1976 [id number]   History:  45 y.o. [id number] presents for annual exam. Irregular menses, some menopausal symptoms. She went over 3 months without a cycle but has had monthly cycles since with varied bleeding pattern. BTL, vasectomy. Was evaluated 04/2020 for menorrhagia, ultrasound 05/12/2020 showed 3 small fibroids, otherwise unremarkable. Discussed management of bleeding with interest in IUD at that time but she decided not to pursue. Normal pap and mammogram history. History of Vitamin D deficiency, HTN, DM, TIA (2019) managed by primary care.   Gynecologic History Patient's last menstrual period was 04/28/2021. Period Duration (Days): 6 Period Pattern: (!) Irregular Menstrual Flow: Heavy Dysmenorrhea: (!) Severe Dysmenorrhea Symptoms: Cramping Contraception: tubal ligation and vasectomy  Health maintenance Last Pap: 4 years ago per patient . Results were: normal Last mammogram: 07/26/2020. Results were: normal Last colonoscopy: 05/02/2021. Results were: Normal, 10-year recall Last Dexa: Not indicated  Past medical history, past surgical history, family history and social history were all reviewed and documented in the EPIC chart. Married. 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 7 to son at [employer], 22 yo daughter at [employer] - considering Psychologist, clinical.  ROS:  A ROS was performed and pertinent positives and negatives are included.  Exam:  Vitals:   05/08/21 1155  BP: 136/74  Weight: 246 lb (111.6 kg)  Height: '5\' 7"'$  (1.702 m)    Body mass index is 38.53 kg/m.  General appearance:  Normal Thyroid:  Symmetrical, normal in size, without palpable masses or nodularity. Respiratory  Auscultation:  Clear without wheezing or rhonchi Cardiovascular  Auscultation:  Regular rate, without rubs, murmurs or gallops  Edema/varicosities:  Not grossly evident Abdominal  Soft,nontender, without masses, guarding  or rebound.  Liver/spleen:  No organomegaly noted  Hernia:  None appreciated  Skin  Inspection:  Grossly normal   Breasts: Examined lying and sitting. Breast reduction scars  Right: Without masses, retractions, discharge or axillary adenopathy.   Left: Without masses, retractions, discharge or axillary adenopathy. Gentitourinary   Inguinal/mons:  Normal without inguinal adenopathy  External genitalia:  Normal  BUS/Urethra/Skene's glands:  Normal  Vagina:  Normal  Cervix:  Normal  Uterus:  Anteverted, normal in size, shape and contour.  Midline and mobile  Adnexa/parametria:     Rt: Without masses or tenderness.   Lt: Without masses or tenderness.  Anus and perineum: Normal  Assessment/Plan:  45 y.o. [id number] for annual exam.    Well female exam with routine gynecological exam - Plan: Cytology - PAP( [facility]). Education provided on SBEs, importance of preventative screenings, current guidelines, high calcium diet, regular exercise, and multivitamin daily.  Labs with PCP.   Perimenopause - irregular menses with mild menopausal symptoms. Is not interested in progestin-only contraception for bleeding management.   Screening for cervical cancer - Normal Pap history.  Pap with HR HPV today.   Screening for breast cancer - Normal mammogram history.  Continue annual screenings.  Normal breast exam today.  Screening for colon cancer - 04/2021 colonoscopy. Will repeat at GI's recommended interval.   Follow up in 1 year for annual       [place], 12:23 PM 05/08/2021

## 2021-05-10 LAB — CYTOLOGY - PAP
Comment: NEGATIVE
Diagnosis: NEGATIVE
High risk HPV: NEGATIVE

## 2021-05-11 ENCOUNTER — Other Ambulatory Visit: Payer: Self-pay | Admitting: Family Medicine

## 2021-05-11 DIAGNOSIS — E1169 Type 2 diabetes mellitus with other specified complication: Secondary | ICD-10-CM

## 2021-05-11 DIAGNOSIS — E669 Obesity, unspecified: Secondary | ICD-10-CM

## 2021-05-19 ENCOUNTER — Other Ambulatory Visit: Payer: Self-pay | Admitting: Family Medicine

## 2021-06-02 ENCOUNTER — Other Ambulatory Visit: Payer: Self-pay | Admitting: Family Medicine

## 2021-06-02 DIAGNOSIS — E669 Obesity, unspecified: Secondary | ICD-10-CM

## 2021-06-02 DIAGNOSIS — E1169 Type 2 diabetes mellitus with other specified complication: Secondary | ICD-10-CM

## 2021-06-04 ENCOUNTER — Other Ambulatory Visit: Payer: Self-pay | Admitting: Family Medicine

## 2021-06-16 ENCOUNTER — Encounter: Payer: Self-pay | Admitting: Family Medicine

## 2021-06-16 ENCOUNTER — Ambulatory Visit (INDEPENDENT_AMBULATORY_CARE_PROVIDER_SITE_OTHER): Payer: BC Managed Care – PPO | Admitting: Family Medicine

## 2021-06-16 ENCOUNTER — Other Ambulatory Visit: Payer: Self-pay

## 2021-06-16 VITALS — BP 128/68 | HR 73 | Temp 98.6°F | Ht 67.0 in | Wt 242.1 lb

## 2021-06-16 DIAGNOSIS — E1169 Type 2 diabetes mellitus with other specified complication: Secondary | ICD-10-CM

## 2021-06-16 DIAGNOSIS — E785 Hyperlipidemia, unspecified: Secondary | ICD-10-CM | POA: Diagnosis not present

## 2021-06-16 LAB — HEMOGLOBIN A1C: Hgb A1c MFr Bld: 7.1 % — ABNORMAL HIGH (ref 4.6–6.5)

## 2021-06-16 NOTE — Progress Notes (Addendum)
Subjective:   Chief Complaint  Patient presents with   Follow-up    Diabetes    Rhonda Le is a 45 y.o. female here for follow-up of diabetes.   Rhonda Le does not routinely monitor her sugars.  Patient does not require insulin.   Medications include: metformin 1000 mg bid, Ozempic 0.25 mg/week Diet is healthy.  Exercise: walking, lifting weights No Cp or SOB.  Last a1c was 7.9.   Past Medical History:  Diagnosis Date   Anemia    on meds   Biliary colic    Constipation    High cholesterol    on meds   Hypertension    on meds   Morbid obesity ([facility])    TIA (transient ischemic attack) 2019   caused by extreme stress, no residual   Type 2 diabetes mellitus ([place])    on meds     Related testing: Retinal exam: Done Pneumovax: done  Objective:  BP 128/68   Pulse 73   Temp 98.6 F (37 C) (Oral)   Ht '5\' 7"'$  (1.702 m)   Wt 242 lb 2 oz (109.8 kg)   SpO2 97%   BMI 37.92 kg/m  General:  Well developed, well nourished, in no apparent distress Head:  Normocephalic, atraumatic Lungs:  CTAB, no access msc use Cardio:  RRR, no bruits, no LE edema Psych: Age appropriate judgment and insight  Assessment:   Hyperlipidemia associated with type 2 diabetes mellitus ([place]) - Plan: Hemoglobin A1c   Plan:   Chronic, uncontrolled. Ck A1c. Cont Ozempic 0.25 mg/week for another 2 doses and then increase to 0.5 mg/week. Would consider adding Farxiga if not at goal. Counseled on diet and exercise. F/u in 3-6 mo pending a1c.  The patient voiced understanding and agreement to the plan.  [place], DO 06/16/21 10:38 AM

## 2021-06-16 NOTE — Patient Instructions (Signed)
Give us 2-3 business days to get the results of your labs back.   Keep the diet clean and stay active.  I recommend getting the flu shot in mid October. This suggestion would change if the CDC comes out with a different recommendation.   Let us know if you need anything. 

## 2021-07-03 ENCOUNTER — Other Ambulatory Visit: Payer: Self-pay | Admitting: Family Medicine

## 2021-07-19 ENCOUNTER — Other Ambulatory Visit (HOSPITAL_BASED_OUTPATIENT_CLINIC_OR_DEPARTMENT_OTHER): Payer: Self-pay | Admitting: Family Medicine

## 2021-07-19 DIAGNOSIS — Z1231 Encounter for screening mammogram for malignant neoplasm of breast: Secondary | ICD-10-CM

## 2021-07-21 ENCOUNTER — Other Ambulatory Visit: Payer: Self-pay | Admitting: Family Medicine

## 2021-07-21 DIAGNOSIS — E1169 Type 2 diabetes mellitus with other specified complication: Secondary | ICD-10-CM

## 2021-07-21 DIAGNOSIS — E669 Obesity, unspecified: Secondary | ICD-10-CM

## 2021-07-21 MED ORDER — OZEMPIC (0.25 OR 0.5 MG/DOSE) 2 MG/1.5ML ~~LOC~~ SOPN: 0.5000 mg | PEN_INJECTOR | SUBCUTANEOUS | 2 refills | Status: DC

## 2021-08-05 ENCOUNTER — Other Ambulatory Visit: Payer: Self-pay | Admitting: Family Medicine

## 2021-08-21 ENCOUNTER — Other Ambulatory Visit: Payer: Self-pay

## 2021-08-21 ENCOUNTER — Ambulatory Visit (HOSPITAL_BASED_OUTPATIENT_CLINIC_OR_DEPARTMENT_OTHER)
Admission: RE | Admit: 2021-08-21 | Discharge: 2021-08-21 | Disposition: A | Discharge status: Home / Self Care | Payer: BC Managed Care – PPO | Source: Ambulatory Visit | Attending: Family Medicine | Admitting: Family Medicine

## 2021-08-21 ENCOUNTER — Encounter (HOSPITAL_BASED_OUTPATIENT_CLINIC_OR_DEPARTMENT_OTHER): Payer: Self-pay

## 2021-08-21 DIAGNOSIS — Z1231 Encounter for screening mammogram for malignant neoplasm of breast: Secondary | ICD-10-CM | POA: Diagnosis present

## 2021-09-01 ENCOUNTER — Other Ambulatory Visit: Payer: Self-pay | Admitting: Family Medicine

## 2021-09-10 ENCOUNTER — Other Ambulatory Visit: Payer: Self-pay | Admitting: Family Medicine

## 2021-09-10 DIAGNOSIS — E669 Obesity, unspecified: Secondary | ICD-10-CM

## 2021-09-15 ENCOUNTER — Ambulatory Visit: Payer: BC Managed Care – PPO | Admitting: Family Medicine

## 2021-09-15 ENCOUNTER — Encounter: Payer: Self-pay | Admitting: Family Medicine

## 2021-09-15 ENCOUNTER — Other Ambulatory Visit: Payer: Self-pay | Admitting: Family Medicine

## 2021-09-15 VITALS — BP 122/82 | HR 107 | Temp 98.4°F | Ht 67.0 in | Wt 240.4 lb

## 2021-09-15 DIAGNOSIS — E1169 Type 2 diabetes mellitus with other specified complication: Secondary | ICD-10-CM

## 2021-09-15 DIAGNOSIS — E785 Hyperlipidemia, unspecified: Secondary | ICD-10-CM | POA: Diagnosis not present

## 2021-09-15 DIAGNOSIS — R945 Abnormal results of liver function studies: Secondary | ICD-10-CM

## 2021-09-15 DIAGNOSIS — Z23 Encounter for immunization: Secondary | ICD-10-CM | POA: Diagnosis not present

## 2021-09-15 LAB — LIPID PANEL
Cholesterol: 127 mg/dL (ref 0–200)
HDL: 40.6 mg/dL (ref >39.00)
LDL Cholesterol: 52 mg/dL (ref 0–99)
NonHDL: 85.97
Total CHOL/HDL Ratio: 3
Triglycerides: 172 mg/dL — ABNORMAL HIGH (ref 0.0–149.0)
VLDL: 34.4 mg/dL (ref 0.0–40.0)

## 2021-09-15 LAB — COMPREHENSIVE METABOLIC PANEL
ALT: 38 U/L — ABNORMAL HIGH (ref 0–35)
AST: 29 U/L (ref 0–37)
Albumin: 4.3 g/dL (ref 3.5–5.2)
Alkaline Phosphatase: 56 U/L (ref 39–117)
BUN: 10 mg/dL (ref 6–23)
CO2: 24 mEq/L (ref 19–32)
Calcium: 9.6 mg/dL (ref 8.4–10.5)
Chloride: 105 mEq/L (ref 96–112)
Creatinine, Ser: 0.88 mg/dL (ref 0.40–1.20)
GFR: 78.05 mL/min (ref >60.00)
Glucose, Bld: 100 mg/dL — ABNORMAL HIGH (ref 70–99)
Potassium: 4.2 mEq/L (ref 3.5–5.1)
Sodium: 137 mEq/L (ref 135–145)
Total Bilirubin: 0.5 mg/dL (ref 0.2–1.2)
Total Protein: 7 g/dL (ref 6.0–8.3)

## 2021-09-15 LAB — HEMOGLOBIN A1C: Hgb A1c MFr Bld: 6.9 % — ABNORMAL HIGH (ref 4.6–6.5)

## 2021-09-15 NOTE — Progress Notes (Signed)
Subjective:   Chief Complaint  Patient presents with   Follow-up    Labs today    Rhonda Le is a 45 y.o. female here for follow-up of diabetes.   Caya does not routinely check her sugars.  Patient denies hypoglycemic reactions. Patient does not require insulin.   Medications include: Ozempic 0.5 mg/week, metformin 500 mg bid Diet is fair.  Exercise: lifting wts, cardio No Cp or SOB.   Past Medical History:  Diagnosis Date   Anemia    on meds   Biliary colic    Constipation    High cholesterol    on meds   Hypertension    on meds   Morbid obesity ([facility])    TIA (transient ischemic attack) 2019   caused by extreme stress, no residual   Type 2 diabetes mellitus ([place])    on meds     Related testing: Retinal exam: Done Pneumovax: Due for PCV20  Objective:  BP 122/82   Pulse (!) 107   Temp 98.4 F (36.9 C) (Oral)   Ht 5\' 7"  (1.702 m)   Wt 240 lb 6 oz (109 kg)   SpO2 98%   BMI 37.65 kg/m  General:  Well developed, well nourished, in no apparent distress Skin:  Warm, no pallor or diaphoresis Lungs:  CTAB, no access msc use Cardio:  RRR, no bruits, no LE edema Psych: Age appropriate judgment and insight  Assessment:   Hyperlipidemia associated with type 2 diabetes mellitus ([place]) - Plan: Comprehensive metabolic panel, Lipid panel, Hemoglobin A1c  Need for vaccination against Streptococcus pneumoniae - Plan: Pneumococcal conjugate vaccine 20-valent (Prevnar 20)   Plan:   Chronic, uncontrolled.  Last A1c 7.1.  Continue metformin 500 mg twice daily, Ozempic 0.5 mg weekly.  If not controlled, will change Ozempic to [facility] and continue metformin.  Counseled on diet and exercise.  PCV 20 today. F/u in 3-6 mo pending the above results. The patient voiced understanding and agreement to the plan.  [place], DO 09/15/21 10:27 AM

## 2021-09-15 NOTE — Patient Instructions (Addendum)
Give [place] 2-3 business days to get the results of your labs back.   Keep the diet clean and stay active.  I recommend getting the updated bivalent covid vaccination booster at your convenience.   Let us know if you need anything.

## 2021-09-26 ENCOUNTER — Other Ambulatory Visit (INDEPENDENT_AMBULATORY_CARE_PROVIDER_SITE_OTHER): Payer: BC Managed Care – PPO

## 2021-09-26 DIAGNOSIS — R945 Abnormal results of liver function studies: Secondary | ICD-10-CM | POA: Diagnosis not present

## 2021-09-26 LAB — HEPATIC FUNCTION PANEL
ALT: 31 U/L (ref 0–35)
AST: 25 U/L (ref 0–37)
Albumin: 4.1 g/dL (ref 3.5–5.2)
Alkaline Phosphatase: 53 U/L (ref 39–117)
Bilirubin, Direct: 0.1 mg/dL (ref 0.0–0.3)
Total Bilirubin: 0.5 mg/dL (ref 0.2–1.2)
Total Protein: 6.7 g/dL (ref 6.0–8.3)

## 2021-09-27 ENCOUNTER — Other Ambulatory Visit: Payer: Self-pay | Admitting: Family Medicine

## 2021-10-10 ENCOUNTER — Other Ambulatory Visit: Payer: Self-pay | Admitting: Family Medicine

## 2021-10-10 ENCOUNTER — Encounter: Payer: Self-pay | Admitting: Family Medicine

## 2021-10-31 ENCOUNTER — Other Ambulatory Visit: Payer: Self-pay | Admitting: Family Medicine

## 2021-11-28 ENCOUNTER — Other Ambulatory Visit: Payer: Self-pay | Admitting: Family Medicine

## 2021-12-24 ENCOUNTER — Other Ambulatory Visit: Payer: Self-pay | Admitting: Family Medicine

## 2021-12-31 ENCOUNTER — Other Ambulatory Visit: Payer: Self-pay | Admitting: Family Medicine

## 2022-01-01 ENCOUNTER — Other Ambulatory Visit: Payer: Self-pay | Admitting: Family Medicine

## 2022-01-01 DIAGNOSIS — E1169 Type 2 diabetes mellitus with other specified complication: Secondary | ICD-10-CM

## 2022-01-01 MED ORDER — LOSARTAN POTASSIUM 100 MG PO TABS: 100.0000 mg | ORAL_TABLET | Freq: Every day | ORAL | 1 refills | Status: DC

## 2022-01-01 MED ORDER — METFORMIN HCL 1000 MG PO TABS: ORAL_TABLET | ORAL | 1 refills | Status: DC

## 2022-01-01 MED ORDER — ATORVASTATIN CALCIUM 40 MG PO TABS: 40.0000 mg | ORAL_TABLET | Freq: Every day | ORAL | 1 refills | Status: DC

## 2022-01-02 ENCOUNTER — Other Ambulatory Visit: Payer: Self-pay | Admitting: Family Medicine

## 2022-01-02 DIAGNOSIS — E1169 Type 2 diabetes mellitus with other specified complication: Secondary | ICD-10-CM

## 2022-01-05 ENCOUNTER — Encounter: Payer: Self-pay | Admitting: Family Medicine

## 2022-01-05 MED ORDER — OZEMPIC (0.25 OR 0.5 MG/DOSE) 2 MG/1.5ML ~~LOC~~ SOPN: 0.5000 mg | PEN_INJECTOR | SUBCUTANEOUS | 1 refills | Status: DC

## 2022-02-27 ENCOUNTER — Encounter: Payer: Self-pay | Admitting: Family Medicine

## 2022-03-16 ENCOUNTER — Encounter: Payer: BC Managed Care – PPO | Admitting: Family Medicine

## 2022-03-21 ENCOUNTER — Encounter: Payer: BC Managed Care – PPO | Admitting: Family Medicine

## 2022-03-26 ENCOUNTER — Telehealth: Payer: Self-pay | Admitting: Family Medicine

## 2022-03-26 NOTE — Telephone Encounter (Signed)
Pt brought in forms to be filled out Patient would like to be called when ready for pick up  Placed in wendling bin up front

## 2022-03-26 NOTE — Telephone Encounter (Signed)
PCP completed. Made a copy for scan. Called the patient to inform ready for pickup BUT the patient needs to schedule an appointment

## 2022-03-26 NOTE — Telephone Encounter (Signed)
Called informed to pickup form. She has no coverage currently//had to cancel her June appt. She will call once she gets a job/and insurance.

## 2022-05-09 ENCOUNTER — Ambulatory Visit: Payer: BC Managed Care – PPO | Admitting: Nurse Practitioner

## 2022-05-10 ENCOUNTER — Other Ambulatory Visit: Payer: Self-pay | Admitting: Family Medicine

## 2022-05-10 NOTE — Telephone Encounter (Signed)
Received approval for patient assistance from [employer]. Approval is through May 03, 2023. A four  Month supply of the medicine requested is being shipped to our office and should receive within 10-14 business days. Approval is for Ozempic.  Called the patient informed of approval and that medicine will be shipped to our office. The patient verbalized understanding/had no other questions.

## 2022-05-23 ENCOUNTER — Encounter (INDEPENDENT_AMBULATORY_CARE_PROVIDER_SITE_OTHER): Payer: Self-pay

## 2022-05-23 ENCOUNTER — Encounter: Payer: Self-pay | Admitting: Family Medicine

## 2022-05-31 ENCOUNTER — Telehealth: Payer: Self-pay

## 2022-05-31 NOTE — Telephone Encounter (Signed)
Pt informed Ozempic has been received. May pick up at her convenience.

## 2022-06-07 ENCOUNTER — Other Ambulatory Visit: Payer: Self-pay | Admitting: Family Medicine

## 2022-06-07 DIAGNOSIS — E1169 Type 2 diabetes mellitus with other specified complication: Secondary | ICD-10-CM

## 2022-06-09 ENCOUNTER — Other Ambulatory Visit: Payer: Self-pay | Admitting: Family Medicine

## 2022-06-20 ENCOUNTER — Encounter: Payer: Self-pay | Admitting: Nurse Practitioner

## 2022-06-20 ENCOUNTER — Ambulatory Visit (INDEPENDENT_AMBULATORY_CARE_PROVIDER_SITE_OTHER): Payer: BC Managed Care – PPO | Admitting: Nurse Practitioner

## 2022-06-20 VITALS — BP 112/62 | HR 106 | Ht 68.0 in | Wt 242.0 lb

## 2022-06-20 DIAGNOSIS — Z01419 Encounter for gynecological examination (general) (routine) without abnormal findings: Secondary | ICD-10-CM | POA: Diagnosis not present

## 2022-06-20 DIAGNOSIS — N921 Excessive and frequent menstruation with irregular cycle: Secondary | ICD-10-CM

## 2022-06-20 DIAGNOSIS — N951 Menopausal and female climacteric states: Secondary | ICD-10-CM

## 2022-06-20 DIAGNOSIS — D219 Benign neoplasm of connective and other soft tissue, unspecified: Secondary | ICD-10-CM | POA: Diagnosis not present

## 2022-06-20 LAB — CBC WITH DIFFERENTIAL/PLATELET
Absolute Monocytes: 365 cells/uL (ref 200–950)
Basophils Absolute: 53 cells/uL (ref 0–200)
Basophils Relative: 0.7 %
Eosinophils Absolute: 152 cells/uL (ref 15–500)
Eosinophils Relative: 2 %
HCT: 35.2 % (ref 35.0–45.0)
Hemoglobin: 10.9 g/dL — ABNORMAL LOW (ref 11.7–15.5)
Lymphs Abs: 3010 cells/uL (ref 850–3900)
MCH: 25.7 pg — ABNORMAL LOW (ref 27.0–33.0)
MCHC: 31 g/dL — ABNORMAL LOW (ref 32.0–36.0)
MCV: 83 fL (ref 80.0–100.0)
MPV: 11.7 fL (ref 7.5–12.5)
Monocytes Relative: 4.8 %
Neutro Abs: 4020 cells/uL (ref 1500–7800)
Neutrophils Relative %: 52.9 %
Platelets: 401 10*3/uL — ABNORMAL HIGH (ref 140–400)
RBC: 4.24 10*6/uL (ref 3.80–5.10)
RDW: 14.5 % (ref 11.0–15.0)
Total Lymphocyte: 39.6 %
WBC: 7.6 10*3/uL (ref 3.8–10.8)

## 2022-06-20 MED ORDER — NORETHINDRONE 0.35 MG PO TABS: 1.0000 | ORAL_TABLET | Freq: Every day | ORAL | 0 refills | Status: DC

## 2022-06-20 NOTE — Progress Notes (Signed)
Rhonda Le [birth year] [id number]   History:  46 y.o. [id number] presents for annual exam. Last year cycles were occurring every 3-4 months. Since March she has had cycles every 2 weeks. Bleeding is heavy with clots and lasts 7-10 days. She is using largest tampons and pads and changing every couple of hours on heavy days. H/O uterine fibroids - last seen on Ultrasound 04/2020 with largest measuring 2.5 cm. Having some mild menopausal symptoms. Normal pap and mammogram history. History of Vitamin D deficiency, HTN, DM, TIA (2019) managed by primary care.   Gynecologic History Patient's last menstrual period was 06/04/2022 (exact date). Period Cycle (Days):  (14) Period Duration (Days): 7-10 Menstrual Flow: Heavy Menstrual Control: Tampon, Maxi pad Dysmenorrhea: (!) Severe Dysmenorrhea Symptoms: Cramping, Nausea, Diarrhea, Headache (unsure if due to cycle at times or ozempic injection) Contraception: tubal ligation and vasectomy Sexually active: Yes  Health maintenance Last Pap: 05/08/2021. Results were: Normal, 5-year repeat Last mammogram: 08/21/2021. Results were: Normal Last colonoscopy: 05/02/2021. Results were: Normal, 10-year recall Last Dexa: Not indicated  Past medical history, past surgical history, family history and social history were all reviewed and documented in the EPIC chart. Married. 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 Husband is 6th grade teacher. 31 yo son at [employer], 69 yo daughter at [employer] - considering Psychologist, clinical.  ROS:  A ROS was performed and pertinent positives and negatives are included.  Exam:  Vitals:   06/20/22 0902  BP: 112/62  Pulse: (!) 106  SpO2: 97%  Weight: 242 lb (109.8 kg)  Height: 5' 8"  (1.727 m)     Body mass index is 36.8 kg/m.  General appearance:  Normal Thyroid:  Symmetrical, normal in size, without palpable masses or nodularity. Respiratory  Auscultation:  Clear  without wheezing or rhonchi Cardiovascular  Auscultation:  Regular rate, without rubs, murmurs or gallops  Edema/varicosities:  Not grossly evident Abdominal  Soft,nontender, without masses, guarding or rebound.  Liver/spleen:  No organomegaly noted  Hernia:  None appreciated  Skin  Inspection:  Grossly normal Breasts: Examined lying and sitting. Breast reduction scars  Right: Without masses, retractions, discharge or axillary adenopathy.   Left: Without masses, retractions, discharge or axillary adenopathy. Genitourinary   Inguinal/mons:  Normal without inguinal adenopathy  External genitalia:  Normal appearing vulva with no masses, tenderness, or lesions  BUS/Urethra/Skene's glands:  Normal  Vagina:  Normal appearing with normal color and discharge, no lesions  Cervix:  Normal appearing without discharge or lesions  Uterus:  Normal in size, shape and contour.  Midline and mobile, nontender  Adnexa/parametria:     Rt: Normal in size, without masses or tenderness.   Lt: Normal in size, without masses or tenderness.  Anus and perineum: Normal  Digital rectal exam: Normal sphincter tone without palpated masses or tenderness  Patient informed chaperone available to be present for breast and pelvic exam. Patient has requested no chaperone to be present. Patient has been advised what will be completed during breast and pelvic exam.   Assessment/Plan:  46 y.o. [id number] for annual exam.    Well female exam with routine gynecological exam - Education provided on SBEs, importance of preventative screenings, current guidelines, high calcium diet, regular exercise, and multivitamin daily.  Labs with PCP.   Perimenopausal - cycles were every 3-4 months last year, now occurring every 2 weeks. Some mild menopausal symptoms.   Menorrhagia with irregular cycle - Plan: CBC with Differential/Platelet, norethindrone (ORTHO MICRONOR) 0.35  MG tablet, US PELVIS TRANSVAGINAL NON-OB (TV ONLY). Will check  CBC due to heavy, frequent bleeding. Will start Norethindrone for bleeding control. H/O TIA, will avoid estrogen.   Leiomyoma - Plan: US PELVIS TRANSVAGINAL NON-OB (TV ONLY). H/O uterine fibroids - last seen on Ultrasound 04/2020 with largest measuring 2.5 cm.  Screening for cervical cancer - Normal Pap history.  Will repeat at 5-year interval per guidelines.   Screening for breast cancer - Normal mammogram history.  Continue annual screenings.  Normal breast exam today.  Screening for colon cancer - 04/2021 colonoscopy. Will repeat at GI's recommended interval.   Follow up in 1 year for annual.      [name] [facility], 9:35 AM 06/20/2022

## 2022-06-21 ENCOUNTER — Other Ambulatory Visit: Payer: Self-pay | Admitting: Nurse Practitioner

## 2022-06-21 DIAGNOSIS — D500 Iron deficiency anemia secondary to blood loss (chronic): Secondary | ICD-10-CM

## 2022-06-21 MED ORDER — FERROUS SULFATE 325 (65 FE) MG PO TBEC: 325.0000 mg | DELAYED_RELEASE_TABLET | Freq: Every day | ORAL | 0 refills | Status: AC

## 2022-06-22 ENCOUNTER — Encounter: Payer: Self-pay | Admitting: Nurse Practitioner

## 2022-06-22 NOTE — Telephone Encounter (Signed)
Schedule for visit with TW Monday

## 2022-06-22 NOTE — Telephone Encounter (Signed)
FYI. Forward to TW once you have read please and thank you.

## 2022-06-22 NOTE — Telephone Encounter (Signed)
TW typically doesn't do it rectal exam. Likely unrelated, no note of hemorrhoid on exam notes. How heavy is the bleeding? Has this happened before?

## 2022-07-08 ENCOUNTER — Other Ambulatory Visit: Payer: Self-pay | Admitting: Family Medicine

## 2022-07-17 ENCOUNTER — Ambulatory Visit (INDEPENDENT_AMBULATORY_CARE_PROVIDER_SITE_OTHER): Payer: BC Managed Care – PPO

## 2022-07-17 ENCOUNTER — Ambulatory Visit (INDEPENDENT_AMBULATORY_CARE_PROVIDER_SITE_OTHER): Payer: BC Managed Care – PPO | Admitting: Nurse Practitioner

## 2022-07-17 VITALS — BP 114/66

## 2022-07-17 DIAGNOSIS — N921 Excessive and frequent menstruation with irregular cycle: Secondary | ICD-10-CM | POA: Diagnosis not present

## 2022-07-17 DIAGNOSIS — D250 Submucous leiomyoma of uterus: Secondary | ICD-10-CM

## 2022-07-17 DIAGNOSIS — D219 Benign neoplasm of connective and other soft tissue, unspecified: Secondary | ICD-10-CM

## 2022-07-17 DIAGNOSIS — N939 Abnormal uterine and vaginal bleeding, unspecified: Secondary | ICD-10-CM

## 2022-07-17 DIAGNOSIS — R9389 Abnormal findings on diagnostic imaging of other specified body structures: Secondary | ICD-10-CM

## 2022-07-17 DIAGNOSIS — N83201 Unspecified ovarian cyst, right side: Secondary | ICD-10-CM | POA: Diagnosis not present

## 2022-07-17 NOTE — Progress Notes (Unsigned)
   Acute Office Visit  Subjective:    Patient ID: Rhonda Le, female    DOB: [birth year], 46 y.o.   MRN: [id number]   HPI 46 y.o. presents today for ultrasound. Seen 06/20/2022 with changes in menstrual cycles. Last year cycles were occurring every 3-4 months. Since March she has had cycles every 2 weeks. Bleeding is heavy with clots and lasts 7-10 days. She is using largest tampons and pads and changing every couple of hours on heavy days. H/O uterine fibroids - last seen on Ultrasound 04/2020 with largest measuring 2.5 cm. Having some mild menopausal symptoms. Started Norethindrone and iron supplements at last visit.    Review of Systems  Constitutional: Negative.   Genitourinary:  Positive for menstrual problem.       Objective:    Physical Exam Constitutional:      Appearance: Normal appearance.   GU: Not indicated  BP 114/66 (BP Location: Left Arm, Patient Position: Sitting, Cuff Size: Large)   LMP 06/04/2022 (Exact Date)  Wt Readings from Last 3 Encounters:  06/20/22 242 lb (109.8 kg)  09/15/21 240 lb 6 oz (109 kg)  06/16/21 242 lb 2 oz (109.8 kg)       Assessment & Plan:   Problem List Items Addressed This Visit   None Visit Diagnoses     Thickened endometrium    -  Primary   Abnormal uterine bleeding       Intramural, submucous, and subserous leiomyoma of uterus       Right ovarian cyst          Vaginal ultrasound: Anteverted enlarged uterus.  Diffusely heterogeneous myometrium with streaky shadowing suggestive of adenomyosis.  Multiple small intramural, subserosal, and partially submucosal fibroids noted.  Possible submucosal fibroid best seen on 3D images.    Asymmetrically thickened endometrium measuring approximately 12.8 mm.  Anterior and posterior endometrial walls appear to be thick - some vascularity seen in the posterior endometrial wall.  Diffusely heterogeneous appearance on the right ovary - right ovary enlarged with 2 definitive small  hemorrhagic appearing avascular cysts noted, positive perfusion to the ovary.  Left ovary is similar in appearance to the right ovary with no definitive masses seen.  Positive perfusion.  No adnexal masses, no free fluid.  Plan: Ultrasound thoroughly reviewed with patient. Discussed images and plan of care with Dr. Talbert Nan. SHGM with possible EMB recommended. Patient agreeable. Will continue POPs.      [name] DNP, 7:50 AM 07/18/2022

## 2022-07-18 ENCOUNTER — Other Ambulatory Visit: Payer: Self-pay

## 2022-07-18 DIAGNOSIS — N939 Abnormal uterine and vaginal bleeding, unspecified: Secondary | ICD-10-CM

## 2022-07-18 DIAGNOSIS — R9389 Abnormal findings on diagnostic imaging of other specified body structures: Secondary | ICD-10-CM

## 2022-07-24 ENCOUNTER — Other Ambulatory Visit (HOSPITAL_BASED_OUTPATIENT_CLINIC_OR_DEPARTMENT_OTHER): Payer: Self-pay | Admitting: Family Medicine

## 2022-07-24 DIAGNOSIS — Z1231 Encounter for screening mammogram for malignant neoplasm of breast: Secondary | ICD-10-CM

## 2022-07-25 ENCOUNTER — Ambulatory Visit (HOSPITAL_BASED_OUTPATIENT_CLINIC_OR_DEPARTMENT_OTHER)
Admission: RE | Admit: 2022-07-25 | Discharge: 2022-07-25 | Disposition: A | Discharge status: Home / Self Care | Payer: BC Managed Care – PPO | Source: Ambulatory Visit | Attending: Family Medicine | Admitting: Family Medicine

## 2022-07-25 ENCOUNTER — Encounter (HOSPITAL_BASED_OUTPATIENT_CLINIC_OR_DEPARTMENT_OTHER): Payer: Self-pay

## 2022-07-25 DIAGNOSIS — Z1231 Encounter for screening mammogram for malignant neoplasm of breast: Secondary | ICD-10-CM | POA: Insufficient documentation

## 2022-08-06 ENCOUNTER — Other Ambulatory Visit: Payer: Self-pay | Admitting: Family Medicine

## 2022-08-14 ENCOUNTER — Encounter: Payer: Self-pay | Admitting: Obstetrics and Gynecology

## 2022-08-14 NOTE — Telephone Encounter (Signed)
Patient scheduled for [facility]

## 2022-08-14 NOTE — Telephone Encounter (Signed)
Ideally she shouldn't be bleeding heavily. The ideal time to do the sonohysterogram is after her cycle, but that can be very difficult to arrange particularly if her cycles are irregular. So as long as she isn't bleeding heavily I would proceed.

## 2022-08-16 ENCOUNTER — Ambulatory Visit (INDEPENDENT_AMBULATORY_CARE_PROVIDER_SITE_OTHER): Payer: BC Managed Care – PPO

## 2022-08-16 ENCOUNTER — Other Ambulatory Visit: Payer: Self-pay | Admitting: Obstetrics and Gynecology

## 2022-08-16 ENCOUNTER — Other Ambulatory Visit (HOSPITAL_COMMUNITY)
Admission: RE | Admit: 2022-08-16 | Discharge: 2022-08-16 | Disposition: A | Discharge status: Home / Self Care | Payer: BC Managed Care – PPO | Source: Ambulatory Visit | Attending: Obstetrics and Gynecology | Admitting: Obstetrics and Gynecology

## 2022-08-16 ENCOUNTER — Ambulatory Visit (INDEPENDENT_AMBULATORY_CARE_PROVIDER_SITE_OTHER): Payer: BC Managed Care – PPO | Admitting: Obstetrics and Gynecology

## 2022-08-16 ENCOUNTER — Encounter: Payer: Self-pay | Admitting: Obstetrics and Gynecology

## 2022-08-16 VITALS — BP 130/72 | HR 62 | Wt 242.0 lb

## 2022-08-16 DIAGNOSIS — Z8639 Personal history of other endocrine, nutritional and metabolic disease: Secondary | ICD-10-CM | POA: Diagnosis not present

## 2022-08-16 DIAGNOSIS — E1169 Type 2 diabetes mellitus with other specified complication: Secondary | ICD-10-CM | POA: Diagnosis not present

## 2022-08-16 DIAGNOSIS — E119 Type 2 diabetes mellitus without complications: Secondary | ICD-10-CM | POA: Diagnosis not present

## 2022-08-16 DIAGNOSIS — D500 Iron deficiency anemia secondary to blood loss (chronic): Secondary | ICD-10-CM

## 2022-08-16 DIAGNOSIS — E785 Hyperlipidemia, unspecified: Secondary | ICD-10-CM | POA: Diagnosis not present

## 2022-08-16 DIAGNOSIS — R9389 Abnormal findings on diagnostic imaging of other specified body structures: Secondary | ICD-10-CM

## 2022-08-16 DIAGNOSIS — N939 Abnormal uterine and vaginal bleeding, unspecified: Secondary | ICD-10-CM

## 2022-08-16 NOTE — Progress Notes (Signed)
GYNECOLOGY  VISIT   HPI: 46 y.o.   Married Black or [place] American Not Hispanic or Latino  female   [phone] with No LMP recorded. Patient is perimenopausal.   here for further evaluation of AUB.  U/S from last month showed a asymmetrically thickened endometrium and multiple small fibroids, including a possible submucosal myoma.  She was started on micronor in 9/23 to try and manage her AUB and it's not helping. She also complains of severe dysmenorrhea. Menses ~q2 weeks x 7 days. Can saturate a super tampon in up to 30 minutes. Passes large clots. Severe dysmenorrhea x 2 years.   She is diabetic. She is on metformin and ozempic. Doesn't check her FS. Overdue for a HgbA1C.   GYNECOLOGIC HISTORY: No LMP recorded. Patient is perimenopausal. Contraception:tubal ligation and vasectomy Menopausal hormone therapy: none        OB History     Gravida  3   Para  2   Term  1   Preterm  1   AB  1   Living  2      SAB  1   IAB      Ectopic      Multiple      Live Births  2              Patient Active Problem List   Diagnosis Date Noted   Chronic calculous cholecystitis s/p lap cholecystectomy 07/14/2020 07/14/2020   Steatohepatitis, nonalcoholic 76/19/5093   Other hyperlipidemia 03/07/2020   Vitamin D deficiency 01/11/2020   Prediabetes 01/07/2020   Diabetes mellitus ([place]) 01/07/2020   Class 2 severe obesity with serious comorbidity and body mass index (BMI) of 38.0 to 38.9 in adult [facility]) 12/09/2019   Hyperlipidemia associated with type 2 diabetes mellitus ([place]) 11/19/2019   Essential hypertension 12/12/2017   TIA (transient ischemic attack) 10/21/2017   Anemia 10/21/2017   Type 2 diabetes mellitus with obesity ([place]) 10/21/2017    Past Medical History:  Diagnosis Date   Anemia    on meds   Biliary colic    Constipation    High cholesterol    on meds   Hypertension    on meds   Morbid obesity ([place])    TIA (transient ischemic attack) 2019   caused by  extreme stress, no residual   Type 2 diabetes mellitus ([place])    on meds    Past Surgical History:  Procedure Laterality Date   COLONOSCOPY     LAPAROSCOPIC CHOLECYSTECTOMY SINGLE SITE WITH INTRAOPERATIVE CHOLANGIOGRAM N/A 07/14/2020   Procedure: LAPAROSCOPIC CHOLECYSTECTOMY SINGLE SITE WITH INTRAOPERATIVE CHOLANGIOGRAM;  Surgeon: Michael Boston, MD;  Location: [place];  Service: General;  Laterality: N/A;   REDUCTION MAMMAPLASTY Bilateral 2004   TUBAL LIGATION  2003   WISDOM TOOTH EXTRACTION      Current Outpatient Medications  Medication Sig Dispense Refill   atorvastatin (LIPITOR) 40 MG tablet TAKE 1 TABLET BY MOUTH EVERY DAY 90 tablet 1   ferrous sulfate 325 (65 FE) MG EC tablet Take 1 tablet (325 mg total) by mouth daily with breakfast. 90 tablet 0   losartan (COZAAR) 100 MG tablet TAKE 1 TABLET BY MOUTH EVERY DAY 90 tablet 1   metFORMIN (GLUCOPHAGE) 1000 MG tablet TAKE 0.5 TABLETS (500 MG TOTAL) BY MOUTH 2 (TWO) TIMES DAILY WITH A MEAL. 90 tablet 1   norethindrone (ORTHO MICRONOR) 0.35 MG tablet Take 1 tablet (0.35 mg total) by mouth daily. 84 tablet 0   Semaglutide,0.25 or  0.5MG/DOS, (OZEMPIC, 0.25 OR 0.5 MG/DOSE,) 2 MG/1.5ML SOPN Inject 0.5 mg into the skin once a week. 3 mL 1   Vitamin D, Ergocalciferol, (DRISDOL) 1.25 MG (50000 UNIT) CAPS capsule TAKE 1 CAPSULE BY MOUTH ONE TIME PER WEEK 8 capsule 1   No current facility-administered medications for this visit.     ALLERGIES: Patient has no known allergies.  Family History  Problem Relation Age of Onset   Stroke Mother    Heart disease Mother    Depression Mother    High blood pressure Mother    High Cholesterol Mother    Sudden death Mother        passed away at 31   Alcoholism Mother    Drug abuse Mother    Colon polyps Father 37   Depression Father    High Cholesterol Father    High blood pressure Father    Alcoholism Father    Drug abuse Father    Sudden death Brother 59       train  accident - tragic   Stroke Maternal Aunt    Colon cancer Neg Hx    Esophageal cancer Neg Hx    Stomach cancer Neg Hx    Rectal cancer Neg Hx     Social History   Socioeconomic History   Marital status: Married    Spouse name: Not on file   Number of children: Not on file   Years of education: Not on file   Highest education level: Not on file  Occupational History   Occupation: Customer service manager  Tobacco Use   Smoking status: Never   Smokeless tobacco: Never  Vaping Use   Vaping Use: Never used  Substance and Sexual Activity   Alcohol use: Not Currently   Drug use: No   Sexual activity: Yes    Partners: Male    Birth control/protection: Surgical    Comment: BTL  Other Topics Concern   Not on file  Social History Narrative   Not on file   Social Determinants of Health   Financial Resource Strain: Not on file  Food Insecurity: Not on file  Transportation Needs: Not on file  Physical Activity: Not on file  Stress: Not on file  Social Connections: Not on file  Intimate Partner Violence: Not on file   Sonohysterogram & Endometrial biopsy:  The procedure and risks of the procedure were reviewed with the patient, consent form was signed.  A speculum was placed in the vagina and the cervix was cleansed with betadine. The uterine evacuator catheter was inserted into the uterine cavity without difficulty. Saline was infused under direct observation with the ultrasound. No intracavitary defects were noted. The fluid was removed from the endometrium  The uterine evacuator was then used to perform the endometrial biopsy, taking care to get a representative sample, sampling 360 degrees of the uterine cavity. Moderate tissue was obtained. The tenaculum and speculum were removed. There were no complications.   ROS  PHYSICAL EXAMINATION:    There were no vitals taken for this visit.    General appearance: alert, cooperative and appears stated age   Pelvic:  External genitalia:  no lesions              Urethra:  normal appearing urethra with no masses, tenderness or lesions              Bartholins and Skenes: normal  Vagina: normal appearing vagina with normal color and discharge, no lesions              Cervix: no lesions                Chaperone was present for exam.  1. Abnormal uterine bleeding U/S c/w adenomyosis, 2 small intramural myomas, negative sonohysterogram. - Endometrial biopsy - CBC - Ferritin - TSH - Surgical pathology( [place]/ [facility]) -Discussed options for care, including: mirena IUD, daily oral progesterone, depo-provera, possible endometrial biopsy (higher risk of failure with adenomyosis) and TLH/BS. -information on the mirena IUD was given -She is leaning toward [place], will discuss with her husband and let me know.   2. Thickened endometrium - Endometrial biopsy  3. History of diabetes mellitus - Hemoglobin A1c  4. Iron deficiency anemia due to chronic blood loss - CBC - Ferritin  CC: Marny Lowenstein, NP

## 2022-08-17 ENCOUNTER — Encounter: Payer: Self-pay | Admitting: Obstetrics and Gynecology

## 2022-08-17 ENCOUNTER — Encounter: Payer: Self-pay | Admitting: Family Medicine

## 2022-08-17 LAB — CBC
HCT: 34 % — ABNORMAL LOW (ref 35.0–45.0)
Hemoglobin: 10.4 g/dL — ABNORMAL LOW (ref 11.7–15.5)
MCH: 25.2 pg — ABNORMAL LOW (ref 27.0–33.0)
MCHC: 30.6 g/dL — ABNORMAL LOW (ref 32.0–36.0)
MCV: 82.5 fL (ref 80.0–100.0)
MPV: 11.9 fL (ref 7.5–12.5)
Platelets: 418 10*3/uL — ABNORMAL HIGH (ref 140–400)
RBC: 4.12 10*6/uL (ref 3.80–5.10)
RDW: 14.4 % (ref 11.0–15.0)
WBC: 7.1 10*3/uL (ref 3.8–10.8)

## 2022-08-17 LAB — FERRITIN: Ferritin: 4 ng/mL — ABNORMAL LOW (ref 16–232)

## 2022-08-17 LAB — HEMOGLOBIN A1C
Hgb A1c MFr Bld: 7.1 % of total Hgb — ABNORMAL HIGH (ref <5.7)
Mean Plasma Glucose: 157 mg/dL
eAG (mmol/L): 8.7 mmol/L

## 2022-08-17 LAB — TSH: TSH: 1.7 mIU/L

## 2022-08-20 ENCOUNTER — Other Ambulatory Visit: Payer: Self-pay

## 2022-08-20 DIAGNOSIS — N921 Excessive and frequent menstruation with irregular cycle: Secondary | ICD-10-CM

## 2022-08-20 LAB — SURGICAL PATHOLOGY

## 2022-08-28 ENCOUNTER — Telehealth: Payer: Self-pay | Admitting: Family Medicine

## 2022-08-28 NOTE — Telephone Encounter (Signed)
Received letter from [employer] the medication Ozempic 0.25/.69m (1 Pen X 3 ml/pen)  Should arrive in our office within 10-14 business days--120 day supply of this medication. Patient has 1 more refill left and enrollment expires on 05/03/2023 Patient aware. Will call once receive medication and patient will be notified to pickup .

## 2022-08-30 ENCOUNTER — Telehealth: Payer: Self-pay

## 2022-08-30 NOTE — Telephone Encounter (Signed)
Called and lvm to inform pt that pt assistance has arrived. Left pt a vm to return call.

## 2022-08-31 ENCOUNTER — Other Ambulatory Visit: Payer: Self-pay | Admitting: Family Medicine

## 2022-09-10 ENCOUNTER — Other Ambulatory Visit: Payer: Self-pay | Admitting: Family Medicine

## 2022-09-10 DIAGNOSIS — E1169 Type 2 diabetes mellitus with other specified complication: Secondary | ICD-10-CM

## 2022-09-10 MED ORDER — LOSARTAN POTASSIUM 100 MG PO TABS: 100.0000 mg | ORAL_TABLET | Freq: Every day | ORAL | 1 refills | Status: DC

## 2022-09-10 MED ORDER — ATORVASTATIN CALCIUM 40 MG PO TABS: 40.0000 mg | ORAL_TABLET | Freq: Every day | ORAL | 1 refills | Status: DC

## 2022-09-10 MED ORDER — METFORMIN HCL 1000 MG PO TABS: ORAL_TABLET | ORAL | 1 refills | Status: DC

## 2022-10-01 ENCOUNTER — Other Ambulatory Visit: Payer: Self-pay | Admitting: Family Medicine

## 2022-10-01 MED ORDER — VITAMIN D (ERGOCALCIFEROL) 1.25 MG (50000 UNIT) PO CAPS: ORAL_CAPSULE | ORAL | 1 refills | Status: DC

## 2022-11-27 ENCOUNTER — Telehealth: Payer: Self-pay | Admitting: Family Medicine

## 2022-11-27 NOTE — Telephone Encounter (Signed)
Received Ozempic patient assistance Called left a detailed message Patient assistance has arrived at our office and ready for pickup Is in the Refrigerator

## 2023-01-14 ENCOUNTER — Ambulatory Visit (INDEPENDENT_AMBULATORY_CARE_PROVIDER_SITE_OTHER): Payer: BC Managed Care – PPO | Admitting: Family Medicine

## 2023-01-14 ENCOUNTER — Encounter: Payer: Self-pay | Admitting: Family Medicine

## 2023-01-14 ENCOUNTER — Other Ambulatory Visit: Payer: Self-pay | Admitting: Family Medicine

## 2023-01-14 VITALS — BP 144/86 | HR 108 | Temp 98.6°F | Ht 68.0 in | Wt 248.5 lb

## 2023-01-14 DIAGNOSIS — R030 Elevated blood-pressure reading, without diagnosis of hypertension: Secondary | ICD-10-CM

## 2023-01-14 DIAGNOSIS — E1169 Type 2 diabetes mellitus with other specified complication: Secondary | ICD-10-CM

## 2023-01-14 DIAGNOSIS — E785 Hyperlipidemia, unspecified: Secondary | ICD-10-CM | POA: Diagnosis not present

## 2023-01-14 DIAGNOSIS — Z0000 Encounter for general adult medical examination without abnormal findings: Secondary | ICD-10-CM | POA: Diagnosis not present

## 2023-01-14 MED ORDER — TIRZEPATIDE 7.5 MG/0.5ML ~~LOC~~ SOAJ: 7.5000 mg | SUBCUTANEOUS | 0 refills | Status: DC

## 2023-01-14 MED ORDER — TIRZEPATIDE 15 MG/0.5ML ~~LOC~~ SOAJ: 15.0000 mg | SUBCUTANEOUS | 1 refills | Status: DC

## 2023-01-14 MED ORDER — ONETOUCH VERIO FLEX SYSTEM W/DEVICE KIT: PACK | 0 refills | Status: AC

## 2023-01-14 MED ORDER — TIRZEPATIDE 10 MG/0.5ML ~~LOC~~ SOAJ: 10.0000 mg | SUBCUTANEOUS | 0 refills | Status: DC

## 2023-01-14 MED ORDER — TIRZEPATIDE 12.5 MG/0.5ML ~~LOC~~ SOAJ: 12.5000 mg | SUBCUTANEOUS | 0 refills | Status: DC

## 2023-01-14 MED ORDER — ONETOUCH VERIO VI STRP: ORAL_STRIP | 2 refills | Status: DC

## 2023-01-14 MED ORDER — ONETOUCH DELICA LANCETS 33G MISC: 2 refills | Status: AC

## 2023-01-14 NOTE — Progress Notes (Signed)
Chief Complaint  Patient presents with   Annual Exam     Well Woman Rhonda Le is here for a complete physical.   Her last physical was >1 year ago.  Current diet: in general, a "healthy" diet. Current exercise: walking/cardio. Weight is stable and she denies fatigue out of ordinary. Seatbelt? Yes Advanced directive? No  Health Maintenance Pap/HPV- Yes Mammogram- Yes Tetanus- Yes Hep C screening- Yes HIV screening- Yes  Past Medical History:  Diagnosis Date   Anemia    on meds   Biliary colic    Constipation    High cholesterol    on meds   Hypertension    on meds   Morbid obesity    TIA (transient ischemic attack) 2019   caused by extreme stress, no residual   Type 2 diabetes mellitus    on meds     Past Surgical History:  Procedure Laterality Date   COLONOSCOPY     LAPAROSCOPIC CHOLECYSTECTOMY SINGLE SITE WITH INTRAOPERATIVE CHOLANGIOGRAM N/A 07/14/2020   Procedure: LAPAROSCOPIC CHOLECYSTECTOMY SINGLE SITE WITH INTRAOPERATIVE CHOLANGIOGRAM;  Surgeon: Michael Boston, MD;  Location: [place];  Service: General;  Laterality: N/A;   REDUCTION MAMMAPLASTY Bilateral 2004   TUBAL LIGATION  2003   WISDOM TOOTH EXTRACTION      Medications  Current Outpatient Medications on File Prior to Visit  Medication Sig Dispense Refill   atorvastatin (LIPITOR) 40 MG tablet Take 1 tablet (40 mg total) by mouth daily. 90 tablet 1   losartan (COZAAR) 100 MG tablet Take 1 tablet (100 mg total) by mouth daily. 90 tablet 1   metFORMIN (GLUCOPHAGE) 1000 MG tablet TAKE 0.5 TABLETS (500 MG TOTAL) BY MOUTH 2 (TWO) TIMES DAILY WITH A MEAL. 90 tablet 1   norethindrone (ORTHO MICRONOR) 0.35 MG tablet Take 1 tablet (0.35 mg total) by mouth daily. 84 tablet 0   Semaglutide,0.25 or 0.5MG /DOS, (OZEMPIC, 0.25 OR 0.5 MG/DOSE,) 2 MG/1.5ML SOPN Inject 0.5 mg into the skin once a week. 3 mL 1   Vitamin D, Ergocalciferol, (DRISDOL) 1.25 MG (50000 UNIT) CAPS capsule TAKE 1  CAPSULE BY MOUTH ONE TIME PER WEEK 12 capsule 1   ferrous sulfate 325 (65 FE) MG EC tablet Take 1 tablet (325 mg total) by mouth daily with breakfast. 90 tablet 0   Allergies No Known Allergies  Review of Systems: Constitutional:  no unexpected weight changes Eye:  no recent significant change in vision Ear/Nose/Mouth/Throat:  Ears:  no recent change in hearing Nose/Mouth/Throat:  no complaints of nasal congestion, no sore throat Cardiovascular: no chest pain Respiratory:  no shortness of breath Gastrointestinal:  no abdominal pain, no change in bowel habits GU:  Female: negative for dysuria or pelvic pain Musculoskeletal/Extremities:  no pain of the joints Integumentary (Skin/Breast):  no abnormal skin lesions reported Neurologic:  no headaches Endocrine:  denies fatigue Hematologic/Lymphatic:  No areas of easy bleeding  Exam BP (!) 144/86 (BP Location: Left Arm, Cuff Size: Large)   Pulse (!) 108   Temp 98.6 F (37 C) (Oral)   Ht 5\' 8"  (1.727 m)   Wt 248 lb 8 oz (112.7 kg)   SpO2 97%   BMI 37.78 kg/m  General:  well developed, well nourished, in no apparent distress Skin:  no significant moles, warts, or growths Head:  no masses, lesions, or tenderness Eyes:  pupils equal and round, sclera anicteric without injection Ears:  canals without lesions, TMs shiny without retraction, no obvious effusion, no erythema Nose:  nares  patent, mucosa normal, and no drainage Throat/Pharynx:  lips and gingiva without lesion; tongue and uvula midline; non-inflamed pharynx; no exudates or postnasal drainage Neck: neck supple without adenopathy, thyromegaly, or masses Lungs:  clear to auscultation, breath sounds equal bilaterally, no respiratory distress Cardio:  regular rate and rhythm, no LE edema Abdomen:  abdomen soft, nontender; bowel sounds normal; no masses or organomegaly Genital: Defer to GYN Musculoskeletal:  symmetrical muscle groups noted without atrophy or  deformity Extremities:  no clubbing, cyanosis, or edema, no deformities, no skin discoloration Neuro:  gait normal; deep tendon reflexes normal and symmetric; sensation intact to pinprick over b/l feet Psych: well oriented with normal range of affect and appropriate judgment/insight  Assessment and Plan  Well adult exam - Plan: CBC, Comprehensive metabolic panel, Lipid panel  Hyperlipidemia associated with type 2 diabetes mellitus - Plan: Hemoglobin A1c, Microalbumin / creatinine urine ratio, Ambulatory referral to Ophthalmology  Elevated blood pressure reading   Well 47 y.o. female. Counseled on diet and exercise. Advanced directive form requested today.  Other orders as above. BP: Monitor BP at home. Cont Losartan 100 mg/d. If still elevated over next mo, RTC to discuss next options.  Follow up in 6 mo. The patient voiced understanding and agreement to the plan.  [place], DO 01/14/23 3:29 PM

## 2023-01-14 NOTE — Patient Instructions (Addendum)
Give [place] 2-3 business days to get the results of your labs back.   Keep the diet clean and stay active.  Aim to do some physical exertion for 150 minutes per week. This is typically divided into 5 days per week, 30 minutes per day. The activity should be enough to get your heart rate up. Anything is better than nothing if you have time constraints.  Check your blood pressures 2-3 times per week, alternating the time of day you check it. If it is high, considering waiting 1-2 minutes and rechecking. If it gets higher, your anxiety is likely creeping up and we should avoid rechecking.   I want your blood pressure less than 140 on the top and less than 90 on the bottom consistently. Both goals must be met (ie, 150/70 is too high even though the 70 on the bottom is desirable).   If you do not hear anything about your referral in the next 1-2 weeks, call our office and ask for an update.  Please get me a copy of your advanced directive form at your convenience.   Let us know if you need anything.

## 2023-01-16 ENCOUNTER — Other Ambulatory Visit (INDEPENDENT_AMBULATORY_CARE_PROVIDER_SITE_OTHER): Payer: BC Managed Care – PPO

## 2023-01-16 ENCOUNTER — Encounter: Payer: Self-pay | Admitting: Family Medicine

## 2023-01-16 ENCOUNTER — Other Ambulatory Visit: Payer: Self-pay | Admitting: Family Medicine

## 2023-01-16 ENCOUNTER — Telehealth: Payer: Self-pay | Admitting: Family Medicine

## 2023-01-16 DIAGNOSIS — Z0000 Encounter for general adult medical examination without abnormal findings: Secondary | ICD-10-CM

## 2023-01-16 DIAGNOSIS — E1169 Type 2 diabetes mellitus with other specified complication: Secondary | ICD-10-CM | POA: Diagnosis not present

## 2023-01-16 DIAGNOSIS — E785 Hyperlipidemia, unspecified: Secondary | ICD-10-CM

## 2023-01-16 LAB — LIPID PANEL
Cholesterol: 133 mg/dL (ref 0–200)
HDL: 39.6 mg/dL (ref >39.00)
LDL Cholesterol: 58 mg/dL (ref 0–99)
NonHDL: 93.69
Total CHOL/HDL Ratio: 3
Triglycerides: 176 mg/dL — ABNORMAL HIGH (ref 0.0–149.0)
VLDL: 35.2 mg/dL (ref 0.0–40.0)

## 2023-01-16 LAB — CBC
HCT: 35.2 % — ABNORMAL LOW (ref 36.0–46.0)
Hemoglobin: 11.3 g/dL — ABNORMAL LOW (ref 12.0–15.0)
MCHC: 32.2 g/dL (ref 30.0–36.0)
MCV: 81.7 fl (ref 78.0–100.0)
Platelets: 369 10*3/uL (ref 150.0–400.0)
RBC: 4.3 Mil/uL (ref 3.87–5.11)
RDW: 16.3 % — ABNORMAL HIGH (ref 11.5–15.5)
WBC: 8 10*3/uL (ref 4.0–10.5)

## 2023-01-16 LAB — COMPREHENSIVE METABOLIC PANEL
ALT: 29 U/L (ref 0–35)
AST: 29 U/L (ref 0–37)
Albumin: 4.4 g/dL (ref 3.5–5.2)
Alkaline Phosphatase: 59 U/L (ref 39–117)
BUN: 13 mg/dL (ref 6–23)
CO2: 25 mEq/L (ref 19–32)
Calcium: 9.8 mg/dL (ref 8.4–10.5)
Chloride: 104 mEq/L (ref 96–112)
Creatinine, Ser: 0.93 mg/dL (ref 0.40–1.20)
GFR: 72.36 mL/min (ref >60.00)
Glucose, Bld: 117 mg/dL — ABNORMAL HIGH (ref 70–99)
Potassium: 4.5 mEq/L (ref 3.5–5.1)
Sodium: 136 mEq/L (ref 135–145)
Total Bilirubin: 0.4 mg/dL (ref 0.2–1.2)
Total Protein: 7.2 g/dL (ref 6.0–8.3)

## 2023-01-16 LAB — HEMOGLOBIN A1C: Hgb A1c MFr Bld: 7.4 % — ABNORMAL HIGH (ref 4.6–6.5)

## 2023-01-16 MED ORDER — TIRZEPATIDE 15 MG/0.5ML ~~LOC~~ SOAJ: 15.0000 mg | SUBCUTANEOUS | 1 refills | Status: DC

## 2023-01-16 MED ORDER — TIRZEPATIDE 10 MG/0.5ML ~~LOC~~ SOAJ: 10.0000 mg | SUBCUTANEOUS | 0 refills | Status: AC

## 2023-01-16 MED ORDER — TIRZEPATIDE 7.5 MG/0.5ML ~~LOC~~ SOAJ: 7.5000 mg | SUBCUTANEOUS | 0 refills | Status: AC

## 2023-01-16 MED ORDER — TIRZEPATIDE 12.5 MG/0.5ML ~~LOC~~ SOAJ: 12.5000 mg | SUBCUTANEOUS | 0 refills | Status: AC

## 2023-01-16 NOTE — Telephone Encounter (Signed)
Completed PA Form and faxed. Received approval from 12/16/2022 through 01/15/2024 Patient informed

## 2023-01-18 NOTE — Addendum Note (Signed)
Addended by: Rosita Kea on: 01/18/2023 03:35 PM   Modules accepted: Orders

## 2023-03-14 ENCOUNTER — Other Ambulatory Visit: Payer: Self-pay | Admitting: Family Medicine

## 2023-03-14 DIAGNOSIS — E1169 Type 2 diabetes mellitus with other specified complication: Secondary | ICD-10-CM

## 2023-04-15 ENCOUNTER — Encounter: Payer: Self-pay | Admitting: Family Medicine

## 2023-04-15 ENCOUNTER — Other Ambulatory Visit: Payer: Self-pay | Admitting: Family Medicine

## 2023-04-15 ENCOUNTER — Ambulatory Visit: Payer: BC Managed Care – PPO | Admitting: Family Medicine

## 2023-04-15 VITALS — BP 120/85 | HR 95 | Temp 97.5°F | Ht 68.0 in | Wt 229.5 lb

## 2023-04-15 DIAGNOSIS — I10 Essential (primary) hypertension: Secondary | ICD-10-CM

## 2023-04-15 DIAGNOSIS — E1169 Type 2 diabetes mellitus with other specified complication: Secondary | ICD-10-CM | POA: Diagnosis not present

## 2023-04-15 DIAGNOSIS — Z7985 Long-term (current) use of injectable non-insulin antidiabetic drugs: Secondary | ICD-10-CM

## 2023-04-15 DIAGNOSIS — E669 Obesity, unspecified: Secondary | ICD-10-CM | POA: Diagnosis not present

## 2023-04-15 DIAGNOSIS — Z7984 Long term (current) use of oral hypoglycemic drugs: Secondary | ICD-10-CM | POA: Insufficient documentation

## 2023-04-15 LAB — MICROALBUMIN / CREATININE URINE RATIO
Creatinine,U: 257.2 mg/dL
Microalb Creat Ratio: 0.7 mg/g (ref 0.0–30.0)
Microalb, Ur: 1.9 mg/dL (ref 0.0–1.9)

## 2023-04-15 LAB — HEMOGLOBIN A1C: Hgb A1c MFr Bld: 6.4 % (ref 4.6–6.5)

## 2023-04-15 NOTE — Progress Notes (Addendum)
Subjective:   Chief Complaint  Patient presents with   Follow-up    3 month    Rhonda Le is a 47 y.o. female here for follow-up of diabetes.   Rhonda Le's self monitored glucose range is low 100's.  Patient denies hypoglycemic reactions. She checks her glucose levels every other day. Patient does not require insulin.   Medications include: Mounjaro 15 mg/week, metformin 500 mg bid.  Diet is fair.  Exercise:  wt lifting, cardio  Hypertension Patient presents for hypertension follow up. She does monitor home blood pressures. Blood pressures ranging on average from 110-130's/60-70's. She is compliant with medication- losartan 100 mg/d. Patient has these side effects of medication: none Diet/exercise as above.  No Cp or SOB.   Past Medical History:  Diagnosis Date   Anemia    on meds   Biliary colic    Constipation    High cholesterol    on meds   Hypertension    on meds   Morbid obesity ([facility])    TIA (transient ischemic attack) 2019   caused by extreme stress, no residual   Type 2 diabetes mellitus ([facility])    on meds     Related testing: Retinal exam: Due, scheduled this Fri Pneumovax: done  Objective:  BP 120/85 (BP Location: Left Arm, Patient Position: Sitting, Cuff Size: Large)   Pulse 95   Temp (!) 97.5 F (36.4 C) (Oral)   Ht 5\' 8"  (1.727 m)   Wt 229 lb 8 oz (104.1 kg)   SpO2 99%   BMI 34.90 kg/m  General:  Well developed, well nourished, in no apparent distress Lungs:  CTAB, no access msc use Cardio:  RRR, no bruits, no LE edema Psych: Age appropriate judgment and insight  Assessment:   Type 2 diabetes mellitus with obesity ([facility]) - Plan: Microalbumin / creatinine urine ratio, Hemoglobin A1c  Essential hypertension  Long-term current use of injectable noninsulin antidiabetic medication  Long term current use of oral hypoglycemic drug   Plan:   Chronic, hopefully stable. Cont Mounjaro 15 mg/week, metformin for now-may stop this if a  precipitous drop in A1c. Goal is <7. Counseled on diet and exercise. Chronic, now stable. Cont losartan 100 mg/d. Wt loss has positively affected her BP since last visit and clear on home readings.  F/u in 3-6 mo. The patient voiced understanding and agreement to the plan.  Jilda Roche Norge, DO 04/15/23 7:51 AM

## 2023-04-15 NOTE — Patient Instructions (Signed)
Give us 2-3 business days to get the results of your labs back. Our follow up will be based on your results.  ° °Keep the diet clean and stay active. ° °Let us know if you need anything. °

## 2023-05-03 ENCOUNTER — Encounter: Payer: Self-pay | Admitting: Family Medicine

## 2023-05-03 ENCOUNTER — Other Ambulatory Visit: Payer: Self-pay | Admitting: Family Medicine

## 2023-05-03 MED ORDER — TIRZEPATIDE 15 MG/0.5ML ~~LOC~~ SOAJ: 15.0000 mg | SUBCUTANEOUS | 3 refills | Status: DC

## 2023-05-04 ENCOUNTER — Other Ambulatory Visit (HOSPITAL_BASED_OUTPATIENT_CLINIC_OR_DEPARTMENT_OTHER): Payer: Self-pay

## 2023-05-04 MED ORDER — MOUNJARO 15 MG/0.5ML ~~LOC~~ SOAJ
15.0000 mg | SUBCUTANEOUS | 1 refills | Status: DC
  Filled 2023-05-04: qty 2, 28d supply, fill #0

## 2023-06-05 ENCOUNTER — Emergency Department (HOSPITAL_COMMUNITY)
Admission: EM | Admit: 2023-06-05 | Discharge: 2023-06-06 | Disposition: A | Discharge status: Home / Self Care | Payer: BC Managed Care – PPO | Attending: Emergency Medicine | Admitting: Emergency Medicine

## 2023-06-05 ENCOUNTER — Other Ambulatory Visit: Payer: Self-pay

## 2023-06-05 ENCOUNTER — Encounter (HOSPITAL_COMMUNITY): Payer: Self-pay

## 2023-06-05 DIAGNOSIS — X58XXXA Exposure to other specified factors, initial encounter: Secondary | ICD-10-CM | POA: Insufficient documentation

## 2023-06-05 DIAGNOSIS — T50901A Poisoning by unspecified drugs, medicaments and biological substances, accidental (unintentional), initial encounter: Secondary | ICD-10-CM | POA: Insufficient documentation

## 2023-06-05 DIAGNOSIS — T6591XA Toxic effect of unspecified substance, accidental (unintentional), initial encounter: Secondary | ICD-10-CM

## 2023-06-05 DIAGNOSIS — E876 Hypokalemia: Secondary | ICD-10-CM | POA: Insufficient documentation

## 2023-06-05 DIAGNOSIS — R112 Nausea with vomiting, unspecified: Secondary | ICD-10-CM

## 2023-06-05 DIAGNOSIS — Z794 Long term (current) use of insulin: Secondary | ICD-10-CM | POA: Diagnosis not present

## 2023-06-05 DIAGNOSIS — E119 Type 2 diabetes mellitus without complications: Secondary | ICD-10-CM | POA: Insufficient documentation

## 2023-06-05 LAB — CBC
HCT: 32.1 % — ABNORMAL LOW (ref 36.0–46.0)
Hemoglobin: 9.6 g/dL — ABNORMAL LOW (ref 12.0–15.0)
MCH: 26.2 pg (ref 26.0–34.0)
MCHC: 29.9 g/dL — ABNORMAL LOW (ref 30.0–36.0)
MCV: 87.7 fL (ref 80.0–100.0)
Platelets: 329 10*3/uL (ref 150–400)
RBC: 3.66 MIL/uL — ABNORMAL LOW (ref 3.87–5.11)
RDW: 14.6 % (ref 11.5–15.5)
WBC: 9.9 10*3/uL (ref 4.0–10.5)
nRBC: 0 % (ref 0.0–0.2)

## 2023-06-05 NOTE — ED Triage Notes (Signed)
Patient BIB [facility] from home with complaint of taking an edible around 1900 tonight and at 2200 began feeling nauseated and vomiting.   Patient reports obtaining edibles from vape store and reports that these are regular edibles that she takes at night for sleep.  EMS reports patient vomited 3x with them.   20g L AC  4mg  zofran NS

## 2023-06-06 LAB — COMPREHENSIVE METABOLIC PANEL
ALT: 26 U/L (ref 0–44)
AST: 22 U/L (ref 15–41)
Albumin: 3.4 g/dL — ABNORMAL LOW (ref 3.5–5.0)
Alkaline Phosphatase: 50 U/L (ref 38–126)
Anion gap: 9 (ref 5–15)
BUN: 9 mg/dL (ref 6–20)
CO2: 18 mmol/L — ABNORMAL LOW (ref 22–32)
Calcium: 8.1 mg/dL — ABNORMAL LOW (ref 8.9–10.3)
Chloride: 112 mmol/L — ABNORMAL HIGH (ref 98–111)
Creatinine, Ser: 0.91 mg/dL (ref 0.44–1.00)
GFR, Estimated: >60 mL/min (ref >60)
Glucose, Bld: 165 mg/dL — ABNORMAL HIGH (ref 70–99)
Potassium: 3.3 mmol/L — ABNORMAL LOW (ref 3.5–5.1)
Sodium: 139 mmol/L (ref 135–145)
Total Bilirubin: 0.4 mg/dL (ref 0.3–1.2)
Total Protein: 6.3 g/dL — ABNORMAL LOW (ref 6.5–8.1)

## 2023-06-06 LAB — LIPASE, BLOOD: Lipase: 47 U/L (ref 11–51)

## 2023-06-06 MED ORDER — POTASSIUM CHLORIDE CRYS ER 20 MEQ PO TBCR
40.0000 meq | EXTENDED_RELEASE_TABLET | Freq: Once | ORAL | Status: AC
  Administered 2023-06-06: 40 meq via ORAL
  Filled 2023-06-06: qty 2

## 2023-06-06 MED ORDER — ONDANSETRON HCL 4 MG/2ML IJ SOLN
4.0000 mg | Freq: Once | INTRAMUSCULAR | Status: AC
  Administered 2023-06-06: 4 mg via INTRAVENOUS
  Filled 2023-06-06: qty 2

## 2023-06-06 MED ORDER — SODIUM CHLORIDE 0.9 % IV BOLUS
1000.0000 mL | Freq: Once | INTRAVENOUS | Status: AC
  Administered 2023-06-06: 1000 mL via INTRAVENOUS

## 2023-06-06 NOTE — ED Provider Notes (Signed)
[facility] AT [facility] Provider Note   CSN: [id number] Arrival date & time: 06/05/23  2335     History  Chief Complaint  Patient presents with   Nausea   Emesis   Ingestion    Rhonda Le is a 47 y.o. female.  47 year old female brought in by EMS from home, dad at bedside. Patient reports taking a "bad gummy" tonight, normally takes this to go to sleep but tonight began vomiting. States she fells intermittently high, had facial heaviness earlier and felt like her tongue was swelling. This has all resolved other than vomiting multiple times in the lobby. Denies fevers, chills, abdominal pain, changes in bowel or bladder habits, unilateral weakness/numbness.        Home Medications Prior to Admission medications   Medication Sig Start Date End Date Taking? Authorizing Provider  atorvastatin (LIPITOR) 40 MG tablet TAKE 1 TABLET DAILY 03/15/23   Sharlene Dory, DO  Blood Glucose Monitoring Suppl (ONETOUCH VERIO FLEX SYSTEM) w/Device KIT Use daily to check blood sugar. 01/14/23   Sharlene Dory, DO  ferrous sulfate 325 (65 FE) MG EC tablet Take 1 tablet (325 mg total) by mouth daily with breakfast. 06/21/22 09/19/22  Wyline Beady A, NP  glucose blood (ONETOUCH VERIO) test strip Use daily to check blood sugar. 01/14/23   Sharlene Dory, DO  losartan (COZAAR) 100 MG tablet TAKE 1 TABLET DAILY 03/15/23   Wendling, Jilda Roche, DO  norethindrone (ORTHO MICRONOR) 0.35 MG tablet Take 1 tablet (0.35 mg total) by mouth daily. 06/20/22   Olivia Mackie, NP  OneTouch Delica Lancets 33G MISC Use daily to check blood sugar. 01/14/23   Wendling, Jilda Roche, DO  tirzepatide [facility]) 15 MG/0.5ML Pen Inject 15 mg into the skin once a week. 05/03/23   Wendling, Jilda Roche, DO  tirzepatide [facility]) 15 MG/0.5ML Pen Inject 15 mg into the skin once a week. 05/04/23     Vitamin D, Ergocalciferol, (DRISDOL) 1.25 MG (50000 UNIT) CAPS  capsule TAKE 1 CAPSULE ONCE A WEEK 03/15/23   Wendling, Jilda Roche, DO      Allergies    Patient has no known allergies.    Review of Systems   Review of Systems Negative except as per HPI Physical Exam Updated Vital Signs BP 137/86 (BP Location: Right Arm)   Pulse 80   Temp 98.2 F (36.8 C) (Oral)   Resp 20   Ht 5\' 7"  (1.702 m)   Wt 113.4 kg   SpO2 100%   BMI 39.16 kg/m  Physical Exam Vitals and nursing note reviewed.  Constitutional:      General: She is not in acute distress.    Appearance: She is well-developed. She is not diaphoretic.     Comments: Sleepy, rouses to verbal stimuli   HENT:     Head: Normocephalic and atraumatic.     Mouth/Throat:     Mouth: Mucous membranes are moist.  Eyes:     Pupils: Pupils are equal, round, and reactive to light.  Cardiovascular:     Rate and Rhythm: Normal rate and regular rhythm.     Pulses: Normal pulses.     Heart sounds: Normal heart sounds.  Pulmonary:     Effort: Pulmonary effort is normal.     Breath sounds: Normal breath sounds.  Abdominal:     Palpations: Abdomen is soft.     Tenderness: There is no abdominal tenderness.  Musculoskeletal:     Cervical  back: Neck supple.     Right lower leg: No edema.     Left lower leg: No edema.  Skin:    General: Skin is warm and dry.  Neurological:     Mental Status: She is oriented to person, place, and time.  Psychiatric:        Behavior: Behavior normal.     ED Results / Procedures / Treatments   Labs (all labs ordered are listed, but only abnormal results are displayed) Labs Reviewed  COMPREHENSIVE METABOLIC PANEL - Abnormal; Notable for the following components:      Result Value   Potassium 3.3 (*)    Chloride 112 (*)    CO2 18 (*)    Glucose, Bld 165 (*)    Calcium 8.1 (*)    Total Protein 6.3 (*)    Albumin 3.4 (*)    All other components within normal limits  CBC - Abnormal; Notable for the following components:   RBC 3.66 (*)    Hemoglobin 9.6  (*)    HCT 32.1 (*)    MCHC 29.9 (*)    All other components within normal limits  LIPASE, BLOOD  URINALYSIS, ROUTINE W REFLEX MICROSCOPIC  HCG, SERUM, QUALITATIVE    EKG None  Radiology No results found.  Procedures Procedures    Medications Ordered in ED Medications  ondansetron (ZOFRAN) injection 4 mg (4 mg Intravenous Given 06/06/23 0011)  sodium chloride 0.9 % bolus 1,000 mL (0 mLs Intravenous Stopped 06/06/23 0405)  ondansetron (ZOFRAN) injection 4 mg (4 mg Intravenous Given 06/06/23 0148)  potassium chloride SA (KLOR-CON M) CR tablet 40 mEq (40 mEq Oral Given 06/06/23 0405)    ED Course/ Medical Decision Making/ A&P                                 Medical Decision Making Amount and/or Complexity of Data Reviewed Labs: ordered.  Risk Prescription drug management.   This patient presents to the ED for concern of vomiting, this involves an extensive number of treatment options, and is a complaint that carries with it a high risk of complications and morbidity.  The differential diagnosis includes reaction to CBD gummy, metabolic disturbance, electrolyte derangement    Co morbidities that complicate the patient evaluation  TIA, anemia, diabetes, HLD   Additional history obtained:  Additional history obtained from father at bedside who contributes to history as above External records from outside source obtained and reviewed including prior labs on file for comparison   Lab Tests:  I Ordered, and personally interpreted labs.  The pertinent results include:  lipase normal, CBC with baseline anemia with hgb 9.6. CMP with mild hypokalemia, glucose elevated at 165 (non fasting), CO2 18 normal gap, not in DKA  Problem List / ED Course / Critical interventions / Medication management  47 year old female brought in by EMS for feeling poorly with vomiting at home after taking a gummy at bedtime.  Reports having taken Gummies from this vape shop before without  difficulty however tonight is not reacting well.  States that her face felt heavy however denies unilateral weakness or numbness.  Neuroexam is unremarkable.  Numerous episodes of vomiting in the lobby.  She is provided with Zofran and fluids.  Vomiting resolved, she was able to tolerate p.o. fluids and oral potassium replacement.  Patient wakes with gentle tactile stimuli, is agreeable to going home at this point.  Her  husband is at the bedside, he feels safe taking her home and feels like he can safely get her in the house.  She has been ambulatory with minimal assistance in the department.  Patient is discharged with recommendation to recheck with PCP as needed, return to ER for worsening or concerning symptoms.  Discontinue use of the Gummies. I ordered medication including kdur  for hypokalemia, IVF  Reevaluation of the patient after these medicines showed that the patient improved I have reviewed the patients home medicines and have made adjustments as needed   Social Determinants of Health:  Lives with family   Test / Admission - Considered:  Consider CT head however no report of focal neuro deficit, neuro exam normal         Final Clinical Impression(s) / ED Diagnoses Final diagnoses:  Ingestion of nontoxic substance, accidental or unintentional, initial encounter  Nausea and vomiting, unspecified vomiting type  Hypokalemia    Rx / DC Orders ED Discharge Orders     None         Jeannie Fend, PA-C 06/06/23 0506    Gilda Crease, MD 06/06/23 [id number]

## 2023-06-06 NOTE — Discharge Instructions (Signed)
Your potassium was slightly low tonight.  We replaced it with an oral tablet.  Advised against future use of the Gummies.  Consider melatonin or Unisom tablets to help with sleep.  Follow-up with your doctor for recheck as needed.

## 2023-06-06 NOTE — ED Provider Triage Note (Signed)
Emergency Medicine Provider Triage Evaluation Note  Rhonda Le , a 47 y.o. female  was evaluated in triage.  Pt complains of severe nausea and vomiting after consuming an edible from a new dispensary tonight.  She takes these regularly to help with sleep but has never taken from this store.  States that she has a generalized headache but is mainly complaining of intense nausea.  No abdominal pain, fever, diarrhea.  Review of Systems  Positive: See HPI Negative: See HPI  Physical Exam  BP (!) 172/94 (BP Location: Right Arm)   Pulse 99   Temp 97.6 F (36.4 C) (Oral)   Resp 20   Ht 5\' 7"  (1.702 m)   Wt 113.4 kg   SpO2 98%   BMI 39.16 kg/m  Gen:   Awake, actively vomiting Resp:  Normal effort  MSK:   Moves extremities without difficulty  Other:  Abdomen soft and nontender, mildly dry mucous membranes  Medical Decision Making  Medically screening exam initiated at 12:03 AM.  Appropriate orders placed.  Shalana Besel was informed that the remainder of the evaluation will be completed by another provider, this initial triage assessment does not replace that evaluation, and the importance of remaining in the ED until their evaluation is complete.     Tonette Lederer, PA-C 06/06/23 0010

## 2023-07-29 ENCOUNTER — Other Ambulatory Visit (INDEPENDENT_AMBULATORY_CARE_PROVIDER_SITE_OTHER): Payer: Managed Care, Other (non HMO)

## 2023-07-29 DIAGNOSIS — E1169 Type 2 diabetes mellitus with other specified complication: Secondary | ICD-10-CM

## 2023-07-29 DIAGNOSIS — E669 Obesity, unspecified: Secondary | ICD-10-CM

## 2023-07-29 LAB — HEMOGLOBIN A1C: Hgb A1c MFr Bld: 6.2 % (ref 4.6–6.5)

## 2023-08-16 ENCOUNTER — Other Ambulatory Visit: Payer: Self-pay

## 2023-08-16 MED ORDER — MOUNJARO 15 MG/0.5ML ~~LOC~~ SOAJ: 15.0000 mg | SUBCUTANEOUS | 1 refills | Status: DC

## 2023-08-20 ENCOUNTER — Telehealth: Payer: Self-pay

## 2023-08-20 ENCOUNTER — Encounter: Payer: Self-pay | Admitting: Family Medicine

## 2023-08-20 NOTE — Telephone Encounter (Signed)
PA approved.   [id number];Review Type:Prior Auth;Coverage Start Date:08/20/2023;Coverage End Date:08/19/2024; Authorization Expiration Date: 08/18/2024

## 2023-08-20 NOTE — Telephone Encounter (Signed)
PA initiated via Covermymeds; KEY: [id number]. Awaiting determination.

## 2023-08-28 ENCOUNTER — Encounter: Payer: Self-pay | Admitting: Family Medicine

## 2023-08-28 NOTE — Telephone Encounter (Signed)
Care team updated and letter sent for eye exam notes.

## 2023-09-16 ENCOUNTER — Encounter: Payer: Self-pay | Admitting: Family Medicine

## 2023-09-16 ENCOUNTER — Other Ambulatory Visit: Payer: Self-pay

## 2023-09-16 DIAGNOSIS — E1169 Type 2 diabetes mellitus with other specified complication: Secondary | ICD-10-CM

## 2023-09-16 MED ORDER — ATORVASTATIN CALCIUM 40 MG PO TABS
40.0000 mg | ORAL_TABLET | Freq: Every day | ORAL | 3 refills | Status: DC
Start: 2023-09-16 — End: 2024-08-18

## 2023-09-16 MED ORDER — LOSARTAN POTASSIUM 100 MG PO TABS: 100.0000 mg | ORAL_TABLET | Freq: Every day | ORAL | 3 refills | Status: DC

## 2023-10-21 ENCOUNTER — Ambulatory Visit: Payer: BC Managed Care – PPO | Admitting: Family Medicine

## 2024-01-20 ENCOUNTER — Other Ambulatory Visit: Payer: Self-pay | Admitting: Family Medicine

## 2024-01-27 ENCOUNTER — Other Ambulatory Visit: Payer: Self-pay | Admitting: Family Medicine

## 2024-01-27 DIAGNOSIS — Z1231 Encounter for screening mammogram for malignant neoplasm of breast: Secondary | ICD-10-CM

## 2024-01-28 ENCOUNTER — Ambulatory Visit
Admission: RE | Admit: 2024-01-28 | Discharge: 2024-01-28 | Disposition: A | Discharge status: Home / Self Care | Source: Ambulatory Visit

## 2024-01-28 DIAGNOSIS — Z1231 Encounter for screening mammogram for malignant neoplasm of breast: Secondary | ICD-10-CM

## 2024-01-30 ENCOUNTER — Encounter: Payer: Self-pay | Admitting: Family Medicine

## 2024-01-30 ENCOUNTER — Ambulatory Visit (INDEPENDENT_AMBULATORY_CARE_PROVIDER_SITE_OTHER): Admitting: Family Medicine

## 2024-01-30 VITALS — BP 123/84 | HR 77 | Temp 98.8°F | Resp 16 | Ht 68.0 in | Wt 200.0 lb

## 2024-01-30 DIAGNOSIS — Z0000 Encounter for general adult medical examination without abnormal findings: Secondary | ICD-10-CM | POA: Diagnosis not present

## 2024-01-30 DIAGNOSIS — E559 Vitamin D deficiency, unspecified: Secondary | ICD-10-CM

## 2024-01-30 DIAGNOSIS — E1169 Type 2 diabetes mellitus with other specified complication: Secondary | ICD-10-CM

## 2024-01-30 DIAGNOSIS — Z7985 Long-term (current) use of injectable non-insulin antidiabetic drugs: Secondary | ICD-10-CM

## 2024-01-30 DIAGNOSIS — E669 Obesity, unspecified: Secondary | ICD-10-CM | POA: Diagnosis not present

## 2024-01-30 LAB — CBC
HCT: 35.1 % — ABNORMAL LOW (ref 36.0–46.0)
Hemoglobin: 11.5 g/dL — ABNORMAL LOW (ref 12.0–15.0)
MCHC: 32.8 g/dL (ref 30.0–36.0)
MCV: 88.8 fl (ref 78.0–100.0)
Platelets: 303 10*3/uL (ref 150.0–400.0)
RBC: 3.96 Mil/uL (ref 3.87–5.11)
RDW: 14.5 % (ref 11.5–15.5)
WBC: 5.1 10*3/uL (ref 4.0–10.5)

## 2024-01-30 LAB — VITAMIN D 25 HYDROXY (VIT D DEFICIENCY, FRACTURES): VITD: 38.17 ng/mL (ref 30.00–100.00)

## 2024-01-30 LAB — COMPREHENSIVE METABOLIC PANEL WITH GFR
ALT: 15 U/L (ref 0–35)
AST: 16 U/L (ref 0–37)
Albumin: 4.6 g/dL (ref 3.5–5.2)
Alkaline Phosphatase: 54 U/L (ref 39–117)
BUN: 10 mg/dL (ref 6–23)
CO2: 24 meq/L (ref 19–32)
Calcium: 9.4 mg/dL (ref 8.4–10.5)
Chloride: 109 meq/L (ref 96–112)
Creatinine, Ser: 0.91 mg/dL (ref 0.40–1.20)
GFR: 73.74 mL/min (ref >60.00)
Glucose, Bld: 88 mg/dL (ref 70–99)
Potassium: 4 meq/L (ref 3.5–5.1)
Sodium: 140 meq/L (ref 135–145)
Total Bilirubin: 0.4 mg/dL (ref 0.2–1.2)
Total Protein: 6.8 g/dL (ref 6.0–8.3)

## 2024-01-30 LAB — LIPID PANEL
Cholesterol: 148 mg/dL (ref 0–200)
HDL: 36.5 mg/dL — ABNORMAL LOW (ref >39.00)
LDL Cholesterol: 55 mg/dL (ref 0–99)
NonHDL: 111.58
Total CHOL/HDL Ratio: 4
Triglycerides: 282 mg/dL — ABNORMAL HIGH (ref 0.0–149.0)
VLDL: 56.4 mg/dL — ABNORMAL HIGH (ref 0.0–40.0)

## 2024-01-30 LAB — HEMOGLOBIN A1C: Hgb A1c MFr Bld: 6 % (ref 4.6–6.5)

## 2024-01-30 MED ORDER — MOUNJARO 15 MG/0.5ML ~~LOC~~ SOAJ
15.0000 mg | SUBCUTANEOUS | 1 refills | Status: DC
Start: 2024-01-30 — End: 2024-08-03

## 2024-01-30 MED ORDER — VITAMIN D (ERGOCALCIFEROL) 1.25 MG (50000 UNIT) PO CAPS
ORAL_CAPSULE | ORAL | 3 refills | Status: DC
Start: 2024-01-30 — End: 2024-08-11

## 2024-01-30 MED ORDER — ONETOUCH VERIO VI STRP: ORAL_STRIP | 2 refills | Status: AC

## 2024-01-30 NOTE — Progress Notes (Signed)
 Chief Complaint  Patient presents with   Annual Exam     Well Woman Rhonda Le is here for a complete physical.   Her last physical was >1 year ago.  Current diet: in general, a "healthy" diet. Current exercise: cardio, lifting wts. Weight is intentionally losing and she denies fatigue out of ordinary. Seatbelt? Yes Advanced directive? Yes  Health Maintenance Pap/HPV- Yes Mammogram- Yes Colon cancer screening-Yes Shingrix- No Tetanus- Yes Hep C screening- Yes HIV screening- Yes  Past Medical History:  Diagnosis Date   Anemia    on meds   Biliary colic    Constipation    High cholesterol    on meds   Hypertension    on meds   Morbid obesity (HCC)    TIA (transient ischemic attack) 2019   caused by extreme stress, no residual   Type 2 diabetes mellitus (HCC)    on meds     Past Surgical History:  Procedure Laterality Date   COLONOSCOPY     LAPAROSCOPIC CHOLECYSTECTOMY SINGLE SITE WITH INTRAOPERATIVE CHOLANGIOGRAM N/A 07/14/2020   Procedure: LAPAROSCOPIC CHOLECYSTECTOMY SINGLE SITE WITH INTRAOPERATIVE CHOLANGIOGRAM;  Surgeon: Candyce Champagne, MD;  Location: [facility];  Service: General;  Laterality: N/A;   REDUCTION MAMMAPLASTY Bilateral 2004   TUBAL LIGATION  2003   WISDOM TOOTH EXTRACTION      Medications  Current Outpatient Medications on File Prior to Visit  Medication Sig Dispense Refill   atorvastatin (LIPITOR) 40 MG tablet Take 1 tablet (40 mg total) by mouth daily. 90 tablet 3   Blood Glucose Monitoring Suppl (ONETOUCH VERIO FLEX SYSTEM) w/Device KIT Use daily to check blood sugar. 1 kit 0   ferrous sulfate 325 (65 FE) MG EC tablet Take 1 tablet (325 mg total) by mouth daily with breakfast. 90 tablet 0   glucose blood (ONETOUCH VERIO) test strip Use daily to check blood sugar. 100 each 2   losartan (COZAAR) 100 MG tablet Take 1 tablet (100 mg total) by mouth daily. 90 tablet 3   norethindrone (ORTHO MICRONOR) 0.35 MG tablet Take 1  tablet (0.35 mg total) by mouth daily. 84 tablet 0   OneTouch Delica Lancets 33G MISC Use daily to check blood sugar. 100 each 2   tirzepatide (MOUNJARO) 15 MG/0.5ML Pen Inject 15 mg into the skin once a week. 6 mL 1   Vitamin D, Ergocalciferol, (DRISDOL) 1.25 MG (50000 UNIT) CAPS capsule TAKE 1 CAPSULE ONCE A WEEK 12 capsule 3   Allergies No Known Allergies  Review of Systems: Constitutional:  no unexpected weight changes Eye:  no recent significant change in vision Ear/Nose/Mouth/Throat:  Ears:  no recent change in hearing Nose/Mouth/Throat:  no complaints of nasal congestion, no sore throat Cardiovascular: no chest pain Respiratory:  no shortness of breath Gastrointestinal:  no abdominal pain, no change in bowel habits GU:  Female: negative for dysuria or pelvic pain Musculoskeletal/Extremities:  no pain of the joints Integumentary (Skin/Breast):  no abnormal skin lesions reported Neurologic:  no headaches Endocrine:  denies fatigue  Exam BP 123/84 (BP Location: Right Arm, Patient Position: Sitting, Cuff Size: Large)   Pulse 77   Temp 98.8 F (37.1 C) (Oral)   Resp 16   Ht 5\' 8"  (1.727 m)   Wt 200 lb (90.7 kg)   LMP  (LMP Unknown)   SpO2 100%   BMI 30.41 kg/m  General:  well developed, well nourished, in no apparent distress Skin:  no significant moles, warts, or growths on exposed  skin Head:  no masses, lesions, or tenderness Eyes:  pupils equal and round, sclera anicteric without injection Ears:  canals without lesions, TMs shiny without retraction, no obvious effusion, no erythema Nose:  nares patent, mucosa normal, and no drainage  Throat/Pharynx:  lips and gingiva without lesion; tongue and uvula midline; non-inflamed pharynx; no exudates or postnasal drainage Neck: neck supple without adenopathy, thyromegaly, or masses Lungs:  clear to auscultation, breath sounds equal bilaterally, no respiratory distress Cardio:  regular rate and rhythm, no LE edema Abdomen:   abdomen soft, nontender; bowel sounds normal; no masses or organomegaly Genital: Defer to GYN Musculoskeletal:  symmetrical muscle groups noted without atrophy or deformity Extremities:  no clubbing, cyanosis, or edema, no deformities, no skin discoloration Neuro:  gait normal; deep tendon reflexes normal and symmetric; sensation intact to pinprick b/l Psych: well oriented with normal range of affect and appropriate judgment/insight  Assessment and Plan  Well adult exam - Plan: CBC, Comprehensive metabolic panel with GFR, Lipid panel  Type 2 diabetes mellitus with obesity (HCC) - Plan: Hemoglobin A1c, tirzepatide (MOUNJARO) 15 MG/0.5ML Pen, glucose blood (ONETOUCH VERIO) test strip  Vitamin D deficiency - Plan: Vitamin D, Ergocalciferol, (DRISDOL) 1.25 MG (50000 UNIT) CAPS capsule, VITAMIN D 25 Hydroxy (Vit-D Deficiency, Fractures)   Well 48 y.o. female. Counseled on diet and exercise. Advanced directive form requested today.  Other orders as above. Follow up in 6 mo. The patient voiced understanding and agreement to the plan.  Shellie Dials Mesic, DO 01/30/24 8:27 AM

## 2024-01-30 NOTE — Patient Instructions (Addendum)

## 2024-02-04 ENCOUNTER — Other Ambulatory Visit: Payer: Self-pay

## 2024-02-04 DIAGNOSIS — E1169 Type 2 diabetes mellitus with other specified complication: Secondary | ICD-10-CM

## 2024-02-04 NOTE — Progress Notes (Signed)
 Patient scheduled lab f/u and lipid panel was ordered.

## 2024-03-12 ENCOUNTER — Other Ambulatory Visit (INDEPENDENT_AMBULATORY_CARE_PROVIDER_SITE_OTHER)

## 2024-03-12 ENCOUNTER — Ambulatory Visit: Payer: Self-pay | Admitting: Family Medicine

## 2024-03-12 DIAGNOSIS — E1169 Type 2 diabetes mellitus with other specified complication: Secondary | ICD-10-CM | POA: Diagnosis not present

## 2024-03-12 DIAGNOSIS — E785 Hyperlipidemia, unspecified: Secondary | ICD-10-CM | POA: Diagnosis not present

## 2024-03-12 LAB — LIPID PANEL
Cholesterol: 146 mg/dL (ref 0–200)
HDL: 42.4 mg/dL (ref >39.00)
LDL Cholesterol: 71 mg/dL (ref 0–99)
NonHDL: 103.27
Total CHOL/HDL Ratio: 3
Triglycerides: 163 mg/dL — ABNORMAL HIGH (ref 0.0–149.0)
VLDL: 32.6 mg/dL (ref 0.0–40.0)

## 2024-03-13 ENCOUNTER — Other Ambulatory Visit: Payer: Self-pay

## 2024-03-13 DIAGNOSIS — E781 Pure hyperglyceridemia: Secondary | ICD-10-CM

## 2024-08-01 ENCOUNTER — Other Ambulatory Visit: Payer: Self-pay | Admitting: Family Medicine

## 2024-08-01 DIAGNOSIS — E669 Obesity, unspecified: Secondary | ICD-10-CM

## 2024-08-11 ENCOUNTER — Other Ambulatory Visit: Payer: Self-pay | Admitting: Family Medicine

## 2024-08-11 DIAGNOSIS — E559 Vitamin D deficiency, unspecified: Secondary | ICD-10-CM

## 2024-08-11 MED ORDER — VITAMIN D (ERGOCALCIFEROL) 1.25 MG (50000 UNIT) PO CAPS: ORAL_CAPSULE | ORAL | 3 refills | Status: AC

## 2024-08-11 NOTE — Telephone Encounter (Signed)
 Copied from CRM [id number]. Topic: Clinical - Medication Refill >> Aug 11, 2024  4:01 PM Sasha M wrote: Medication: Vitamin D , Ergocalciferol , (DRISDOL ) 1.25 MG (50000 UNIT) CAPS capsule     Has the patient contacted their pharmacy? Yes (Agent: If no, request that the patient contact the pharmacy for the refill. If patient does not wish to contact the pharmacy document the reason why and proceed with request.) (Agent: If yes, when and what did the pharmacy advise?) Maddie from [employer] called in stating pts mail order prescription was lost in the mail and requesting provider to send in a temporary script to the pharmacy listed below.  This is the patient's preferred pharmacy:   [facility] [address] [place], [place] [address] [phone]  Is this the correct pharmacy for this prescription? Yes If no, delete pharmacy and type the correct one.   Has the prescription been filled recently? Yes  Is the patient out of the medication? Yes  Has the patient been seen for an appointment in the last year OR does the patient have an upcoming appointment? No  Can we respond through MyChart? Yes  Agent: Please be advised that Rx refills may take up to 3 business days. We ask that you follow-up with your pharmacy.

## 2024-08-18 ENCOUNTER — Other Ambulatory Visit: Payer: Self-pay | Admitting: Family Medicine

## 2024-08-18 DIAGNOSIS — E669 Obesity, unspecified: Secondary | ICD-10-CM
